# Patient Record
Sex: Male | Born: 1945 | Smoking: Former smoker
Health system: Southern US, Community
[De-identification: ages and names within clinical notes are randomized; demographics above are authoritative.]

## PROBLEM LIST (undated history)

## (undated) DIAGNOSIS — C801 Malignant (primary) neoplasm, unspecified: Secondary | ICD-10-CM

## (undated) DIAGNOSIS — I10 Essential (primary) hypertension: Secondary | ICD-10-CM

## (undated) DIAGNOSIS — Z87442 Personal history of urinary calculi: Secondary | ICD-10-CM

## (undated) DIAGNOSIS — M1990 Unspecified osteoarthritis, unspecified site: Secondary | ICD-10-CM

## (undated) HISTORY — PX: EYE SURGERY: SHX253

---

## 2012-11-05 ENCOUNTER — Telehealth: Payer: Self-pay | Admitting: Cardiology

## 2012-11-05 NOTE — Telephone Encounter (Signed)
Forward  5 pages from [facility] to Dr. Olga Millers for review on 11-05-12 ym

## 2012-12-04 ENCOUNTER — Ambulatory Visit: Payer: Self-pay | Admitting: Cardiology

## 2012-12-22 ENCOUNTER — Ambulatory Visit: Payer: Self-pay | Admitting: Cardiology

## 2017-05-20 ENCOUNTER — Other Ambulatory Visit: Payer: Self-pay | Admitting: Orthopedic Surgery

## 2017-07-01 ENCOUNTER — Encounter (HOSPITAL_COMMUNITY)
Admission: RE | Admit: 2017-07-01 | Discharge: 2017-07-01 | Disposition: A | Discharge status: Home / Self Care | Payer: Medicare HMO | Source: Ambulatory Visit | Attending: Orthopedic Surgery | Admitting: Orthopedic Surgery

## 2017-07-01 ENCOUNTER — Encounter (HOSPITAL_COMMUNITY): Payer: Self-pay

## 2017-07-01 ENCOUNTER — Ambulatory Visit (HOSPITAL_COMMUNITY)
Admission: RE | Admit: 2017-07-01 | Discharge: 2017-07-01 | Disposition: A | Discharge status: Home / Self Care | Payer: Medicare HMO | Source: Ambulatory Visit | Attending: Orthopedic Surgery | Admitting: Orthopedic Surgery

## 2017-07-01 DIAGNOSIS — Z79899 Other long term (current) drug therapy: Secondary | ICD-10-CM | POA: Diagnosis not present

## 2017-07-01 DIAGNOSIS — R918 Other nonspecific abnormal finding of lung field: Secondary | ICD-10-CM | POA: Insufficient documentation

## 2017-07-01 DIAGNOSIS — M170 Bilateral primary osteoarthritis of knee: Secondary | ICD-10-CM | POA: Insufficient documentation

## 2017-07-01 DIAGNOSIS — Z01818 Encounter for other preprocedural examination: Secondary | ICD-10-CM | POA: Diagnosis present

## 2017-07-01 DIAGNOSIS — Z87891 Personal history of nicotine dependence: Secondary | ICD-10-CM | POA: Insufficient documentation

## 2017-07-01 DIAGNOSIS — I10 Essential (primary) hypertension: Secondary | ICD-10-CM | POA: Insufficient documentation

## 2017-07-01 DIAGNOSIS — Z01812 Encounter for preprocedural laboratory examination: Secondary | ICD-10-CM | POA: Insufficient documentation

## 2017-07-01 DIAGNOSIS — R9431 Abnormal electrocardiogram [ECG] [EKG]: Secondary | ICD-10-CM | POA: Insufficient documentation

## 2017-07-01 HISTORY — DX: Essential (primary) hypertension: I10

## 2017-07-01 HISTORY — DX: Unspecified osteoarthritis, unspecified site: M19.90

## 2017-07-01 HISTORY — DX: Personal history of urinary calculi: Z87.442

## 2017-07-01 HISTORY — DX: Malignant (primary) neoplasm, unspecified: C80.1

## 2017-07-01 LAB — CBC WITH DIFFERENTIAL/PLATELET
Basophils Absolute: 0.1 10*3/uL (ref 0.0–0.1)
Basophils Relative: 1 %
EOS ABS: 0.2 10*3/uL (ref 0.0–0.7)
Eosinophils Relative: 4 %
HEMATOCRIT: 41.7 % (ref 39.0–52.0)
HEMOGLOBIN: 14 g/dL (ref 13.0–17.0)
LYMPHS ABS: 1.3 10*3/uL (ref 0.7–4.0)
Lymphocytes Relative: 19 %
MCH: 30.3 pg (ref 26.0–34.0)
MCHC: 33.6 g/dL (ref 30.0–36.0)
MCV: 90.3 fL (ref 78.0–100.0)
MONO ABS: 0.9 10*3/uL (ref 0.1–1.0)
MONOS PCT: 14 %
NEUTROS ABS: 4.1 10*3/uL (ref 1.7–7.7)
NEUTROS PCT: 62 %
Platelets: 174 10*3/uL (ref 150–400)
RBC: 4.62 MIL/uL (ref 4.22–5.81)
RDW: 12.9 % (ref 11.5–15.5)
WBC: 6.6 10*3/uL (ref 4.0–10.5)

## 2017-07-01 LAB — URINALYSIS, ROUTINE W REFLEX MICROSCOPIC
Bilirubin Urine: NEGATIVE
Glucose, UA: NEGATIVE mg/dL
KETONES UR: NEGATIVE mg/dL
Leukocytes, UA: NEGATIVE
Nitrite: NEGATIVE
PH: 5 (ref 5.0–8.0)
Protein, ur: NEGATIVE mg/dL
SPECIFIC GRAVITY, URINE: 1.018 (ref 1.005–1.030)

## 2017-07-01 LAB — COMPREHENSIVE METABOLIC PANEL
ALK PHOS: 66 U/L (ref 38–126)
ALT: 27 U/L (ref 17–63)
ANION GAP: 10 (ref 5–15)
AST: 20 U/L (ref 15–41)
Albumin: 3.5 g/dL (ref 3.5–5.0)
BILIRUBIN TOTAL: 0.6 mg/dL (ref 0.3–1.2)
BUN: 21 mg/dL — ABNORMAL HIGH (ref 6–20)
CALCIUM: 8.9 mg/dL (ref 8.9–10.3)
CO2: 23 mmol/L (ref 22–32)
Chloride: 107 mmol/L (ref 101–111)
Creatinine, Ser: 1.52 mg/dL — ABNORMAL HIGH (ref 0.61–1.24)
GFR calc non Af Amer: 44 mL/min — ABNORMAL LOW (ref >60)
GFR, EST AFRICAN AMERICAN: 51 mL/min — AB (ref >60)
Glucose, Bld: 120 mg/dL — ABNORMAL HIGH (ref 65–99)
Potassium: 4.1 mmol/L (ref 3.5–5.1)
Sodium: 140 mmol/L (ref 135–145)
TOTAL PROTEIN: 6.5 g/dL (ref 6.5–8.1)

## 2017-07-01 LAB — APTT: aPTT: 29 seconds (ref 24–36)

## 2017-07-01 LAB — SURGICAL PCR SCREEN
MRSA, PCR: NEGATIVE
STAPHYLOCOCCUS AUREUS: NEGATIVE

## 2017-07-01 LAB — PROTIME-INR
INR: 0.93
PROTHROMBIN TIME: 12.5 s (ref 11.4–15.2)

## 2017-07-01 NOTE — Pre-Procedure Instructions (Addendum)
Andres Roach  07/01/2017     No Pharmacies Listed   Your procedure is scheduled on 07/07/17  Report to [facility] Admitting at [place] A.M.  Call this number if you have problems the morning of surgery:  [phone]   Remember:  Do not eat food or drink liquids after midnight.  Take these medicines the morning of surgery with A SIP OF WATER amlodipine,gabapentin, oxycodone if needed  STOP all herbel meds, nsaids (aleve,naproxen,advil,ibuprofen) prior to surgery starting today including all vitamins/supplements, aspirin, fish oil,goodys, bc,diclofenac   Do not wear jewelry, make-up or nail polish.  Do not wear lotions, powders, or perfumes, or deoderant.  Do not shave 48 hours prior to surgery.  Men may shave face and neck.  Do not bring valuables to the hospital.  [facility] is not responsible for any belongings or valuables.  Contacts, dentures or bridgework may not be worn into surgery.  Leave your suitcase in the car.  After surgery it may be brought to your room.  For patients admitted to the hospital, discharge time will be determined by your treatment team.  Patients discharged the day of surgery will not be allowed to drive home.   Special instructions:   Special Instructions: [place] - Preparing for Surgery  Before surgery, you can play an important role.  Because skin is not sterile, your skin needs to be as free of germs as possible.  You can reduce the number of germs on you skin by washing with CHG (chlorahexidine gluconate) soap before surgery.  CHG is an antiseptic cleaner which kills germs and bonds with the skin to continue killing germs even after washing.  Please DO NOT use if you have an allergy to CHG or antibacterial soaps.  If your skin becomes reddened/irritated stop using the CHG and inform your nurse when you arrive at Short Stay.  Do not shave (including legs and underarms) for at least 48 hours prior to the first CHG shower.  You may  shave your face.  Please follow these instructions carefully:   1.  Shower with CHG Soap the night before surgery and the morning of Surgery.  2.  If you choose to wash your hair, wash your hair first as usual with your normal shampoo.  3.  After you shampoo, rinse your hair and body thoroughly to remove the Shampoo.  4.  Use CHG as you would any other liquid soap.  You can apply chg directly  to the skin and wash gently with scrungie or a clean washcloth.  5.  Apply the CHG Soap to your body ONLY FROM THE NECK DOWN.  Do not use on open wounds or open sores.  Avoid contact with your eyes ears, mouth and genitals (private parts).  Wash genitals (private parts)       with your normal soap.  6.  Wash thoroughly, paying special attention to the area where your surgery will be performed.  7.  Thoroughly rinse your body with warm water from the neck down.  8.  DO NOT shower/wash with your normal soap after using and rinsing off the CHG Soap.  9.  Pat yourself dry with a clean towel.            10.  Wear clean pajamas.            11.  Place clean sheets on your bed the night of your first shower and do not sleep with pets.  Day of Surgery  Do not apply any lotions/deodorants the morning of surgery.  Please wear clean clothes to the hospital/surgery center.  Please read over the  fact sheets that you were given.

## 2017-07-02 NOTE — Progress Notes (Signed)
Anesthesia Chart Review:  Pt is a 71 year old male scheduled for R total knee arthroplasty on 07/07/2017 with Dorna Leitz, M.D.  - PCP is Dustin Flock, PA at [facility] in [place], [place]  PMH includes: HTN. Former smoker. BMI 35.  Medications include: Amlodipine, lisinopril-HCTZ  BP 118/83   Pulse 65   Temp 36.6 C   Resp 20   Ht 6' (1.829 m)   Wt 258 lb 6.4 oz (117.2 kg)   SpO2 98%   BMI 35.05 kg/m   Preoperative labs reviewed.  Cr 1.52, BUN 21.   CXR 07/01/17: Minimal right lower lobe linear scarring or atelectasis. In the absence of prior studies demonstrating stability, consider follow-up film to confirm appropriate resolution.  EKG 07/01/17: NSR. Possible Inferior infarct, age undetermined. Appears stable when compared to EKG 12/15/13 (from [facility]).   I routed CMET and CXR results to PCP for follow up purposes.   If no changes, I anticipate pt can proceed with surgery as scheduled.   Willeen Cass, FNP-BC [facility] Short Stay Surgical Center/Anesthesiology Phone: [phone] 07/02/2017 11:43 AM

## 2017-07-04 MED ORDER — CEFAZOLIN SODIUM-DEXTROSE 2-4 GM/100ML-% IV SOLN
2.0000 g | INTRAVENOUS | Status: AC
  Administered 2017-07-07: 2 g via INTRAVENOUS
  Filled 2017-07-04: qty 100

## 2017-07-06 NOTE — H&P (Addendum)
TOTAL KNEE ADMISSION H&P  Patient is being admitted for right total knee arthroplasty.  Subjective:  Chief Complaint:right knee pain.  HPI: Andres Roach, 71 y.o. male, has a history of pain and functional disability in the right knee due to arthritis and has failed non-surgical conservative treatments for greater than 12 weeks to includeNSAID's and/or analgesics, corticosteriod injections, viscosupplementation injections, flexibility and strengthening excercises, weight reduction as appropriate and activity modification.  Onset of symptoms was gradual, starting 3 years ago with gradually worsening course since that time. The patient noted no past surgery on the right knee(s).  Patient currently rates pain in the right knee(s) at 9 out of 10 with activity. Patient has night pain, worsening of pain with activity and weight bearing, pain that interferes with activities of daily living, pain with passive range of motion and joint swelling.  Patient has evidence of subchondral sclerosis and periarticular osteophytes by imaging studies. This patient has had failure of all reasonable conservative care. There is no active infection.  There are no active problems to display for this patient.  Past Medical History:  Diagnosis Date  . Arthritis   . 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 History of kidney stones   . Hypertension     Past Surgical History:  Procedure Laterality Date  . EYE SURGERY Right    cataracts    No prescriptions prior to admission.   Allergies  Allergen Reactions  . No Known Allergies     Social History  Substance Use Topics  . Smoking status: Former Research scientist (life sciences)  . Smokeless tobacco: Never Used     Comment: smoked as teen few yrs  . Alcohol use No    No family history on file.   ROS ROS: I have reviewed the patient's review of systems thoroughly and there are no positive responses as relates to the HPI. Objective:  Physical Exam  Vital signs in last 24  hours:    Vitals:   07/07/17 1208  BP: (!) 159/90  Pulse: 65  Resp: 20  Temp: 98.2 F (36.8 C)  SpO2: 98%   Well-developed well-nourished patient in no acute distress. Alert and oriented x3 HEENT:within normal limits Cardiac: Regular rate and rhythm Pulmonary: Lungs clear to auscultation Abdomen: Soft and nontender.  Normal active bowel sounds  Musculoskeletal: (r knee: painful rom limited rom trace effusion mild varus) Labs: Recent Results (from the past 2160 hour(s))  Surgical pcr screen     Status: None   Collection Time: 07/01/17  9:05 AM  Result Value Ref Range   MRSA, PCR NEGATIVE NEGATIVE   Staphylococcus aureus NEGATIVE NEGATIVE    Comment:        The Xpert SA Assay (FDA approved for NASAL specimens in patients over 66 years of age), is one component of a comprehensive surveillance program.  Test performance has been validated by [facility] for patients greater than or equal to 65 year old. It is not intended to diagnose infection nor to guide or monitor treatment.   Urinalysis, Routine w reflex microscopic     Status: Abnormal   Collection Time: 07/01/17  9:05 AM  Result Value Ref Range   Color, Urine YELLOW YELLOW   APPearance CLEAR CLEAR   Specific Gravity, Urine 1.018 1.005 - 1.030   pH 5.0 5.0 - 8.0   Glucose, UA NEGATIVE NEGATIVE mg/dL   Hgb urine dipstick SMALL (A) NEGATIVE   Bilirubin Urine NEGATIVE NEGATIVE   Ketones,  ur NEGATIVE NEGATIVE mg/dL   Protein, ur NEGATIVE NEGATIVE mg/dL   Nitrite NEGATIVE NEGATIVE   Leukocytes, UA NEGATIVE NEGATIVE   RBC / HPF 0-5 0 - 5 RBC/hpf   WBC, UA 0-5 0 - 5 WBC/hpf   Bacteria, UA FEW (A) NONE SEEN   Squamous Epithelial / LPF 0-5 (A) NONE SEEN   Mucous PRESENT    Hyaline Casts, UA PRESENT   APTT     Status: None   Collection Time: 07/01/17  9:06 AM  Result Value Ref Range   aPTT 29 24 - 36 seconds  CBC WITH DIFFERENTIAL     Status: None   Collection Time: 07/01/17  9:06 AM  Result Value Ref Range    WBC 6.6 4.0 - 10.5 K/uL   RBC 4.62 4.22 - 5.81 MIL/uL   Hemoglobin 14.0 13.0 - 17.0 g/dL   HCT 41.7 39.0 - 52.0 %   MCV 90.3 78.0 - 100.0 fL   MCH 30.3 26.0 - 34.0 pg   MCHC 33.6 30.0 - 36.0 g/dL   RDW 12.9 11.5 - 15.5 %   Platelets 174 150 - 400 K/uL   Neutrophils Relative % 62 %   Neutro Abs 4.1 1.7 - 7.7 K/uL   Lymphocytes Relative 19 %   Lymphs Abs 1.3 0.7 - 4.0 K/uL   Monocytes Relative 14 %   Monocytes Absolute 0.9 0.1 - 1.0 K/uL   Eosinophils Relative 4 %   Eosinophils Absolute 0.2 0.0 - 0.7 K/uL   Basophils Relative 1 %   Basophils Absolute 0.1 0.0 - 0.1 K/uL  Comprehensive metabolic panel     Status: Abnormal   Collection Time: 07/01/17  9:06 AM  Result Value Ref Range   Sodium 140 135 - 145 mmol/L   Potassium 4.1 3.5 - 5.1 mmol/L   Chloride 107 101 - 111 mmol/L   CO2 23 22 - 32 mmol/L   Glucose, Bld 120 (H) 65 - 99 mg/dL   BUN 21 (H) 6 - 20 mg/dL   Creatinine, Ser 1.52 (H) 0.61 - 1.24 mg/dL   Calcium 8.9 8.9 - 10.3 mg/dL   Total Protein 6.5 6.5 - 8.1 g/dL   Albumin 3.5 3.5 - 5.0 g/dL   AST 20 15 - 41 U/L   ALT 27 17 - 63 U/L   Alkaline Phosphatase 66 38 - 126 U/L   Total Bilirubin 0.6 0.3 - 1.2 mg/dL   GFR calc non Af Amer 44 (L) >60 mL/min   GFR calc Af Amer 51 (L) >60 mL/min    Comment: (NOTE) The eGFR has been calculated using the CKD EPI equation. This calculation has not been validated in all clinical situations. eGFR's persistently <60 mL/min signify possible Chronic Kidney Disease.    Anion gap 10 5 - 15  Protime-INR     Status: None   Collection Time: 07/01/17  9:06 AM  Result Value Ref Range   Prothrombin Time 12.5 11.4 - 15.2 seconds   INR 0.93     Estimated body mass index is 35.05 kg/m as calculated from the following:   Height as of 07/01/17: 6' (1.829 m).   Weight as of 07/01/17: 117.2 kg (258 lb 6.4 oz).   Imaging Review Plain radiographs demonstrate moderate degenerative joint disease of the right knee(s). The overall alignment ismild  varus. The bone quality appears to be fair for age and reported activity level.  Assessment/Plan:  End stage arthritis, right knee   The patient history, physical examination, clinical  judgment of the provider and imaging studies are consistent with end stage degenerative joint disease of the right knee(s) and total knee arthroplasty is deemed medically necessary. The treatment options including medical management, injection therapy arthroscopy and arthroplasty were discussed at length. The risks and benefits of total knee arthroplasty were presented and reviewed. The risks due to aseptic loosening, infection, stiffness, patella tracking problems, thromboembolic complications and other imponderables were discussed. The patient acknowledged the explanation, agreed to proceed with the plan and consent was signed. Patient is being admitted for inpatient treatment for surgery, pain control, PT, OT, prophylactic antibiotics, VTE prophylaxis, progressive ambulation and ADL's and discharge planning. The patient is planning to be discharged home with home health services

## 2017-07-07 ENCOUNTER — Encounter (HOSPITAL_COMMUNITY)
Admission: RE | Disposition: A | Discharge status: Home Health | Payer: Self-pay | Source: Ambulatory Visit | Attending: Orthopedic Surgery

## 2017-07-07 ENCOUNTER — Inpatient Hospital Stay (HOSPITAL_COMMUNITY): Payer: Medicare HMO | Admitting: Anesthesiology

## 2017-07-07 ENCOUNTER — Inpatient Hospital Stay (HOSPITAL_COMMUNITY)
Admission: RE | Admit: 2017-07-07 | Discharge: 2017-07-10 | DRG: 470 | Disposition: A | Discharge status: Home Health | Payer: Medicare HMO | Source: Ambulatory Visit | Attending: Orthopedic Surgery | Admitting: Orthopedic Surgery

## 2017-07-07 ENCOUNTER — Inpatient Hospital Stay (HOSPITAL_COMMUNITY): Payer: Medicare HMO | Admitting: Emergency Medicine

## 2017-07-07 ENCOUNTER — Encounter (HOSPITAL_COMMUNITY): Payer: Self-pay

## 2017-07-07 DIAGNOSIS — Z7982 Long term (current) use of aspirin: Secondary | ICD-10-CM

## 2017-07-07 DIAGNOSIS — Z85828 Personal history of other malignant neoplasm of skin: Secondary | ICD-10-CM

## 2017-07-07 DIAGNOSIS — I10 Essential (primary) hypertension: Secondary | ICD-10-CM | POA: Diagnosis present

## 2017-07-07 DIAGNOSIS — M170 Bilateral primary osteoarthritis of knee: Secondary | ICD-10-CM | POA: Diagnosis present

## 2017-07-07 DIAGNOSIS — Z87891 Personal history of nicotine dependence: Secondary | ICD-10-CM | POA: Diagnosis not present

## 2017-07-07 DIAGNOSIS — M1711 Unilateral primary osteoarthritis, right knee: Secondary | ICD-10-CM | POA: Diagnosis present

## 2017-07-07 DIAGNOSIS — M1990 Unspecified osteoarthritis, unspecified site: Secondary | ICD-10-CM | POA: Diagnosis present

## 2017-07-07 HISTORY — PX: TOTAL KNEE ARTHROPLASTY: SHX125

## 2017-07-07 SURGERY — ARTHROPLASTY, KNEE, TOTAL
Anesthesia: Regional | Site: Knee | Laterality: Right

## 2017-07-07 MED ORDER — ALUM & MAG HYDROXIDE-SIMETH 200-200-20 MG/5ML PO SUSP: 30.0000 mL | ORAL | Status: DC | PRN

## 2017-07-07 MED ORDER — HYDROMORPHONE HCL 1 MG/ML IJ SOLN
0.2500 mg | INTRAMUSCULAR | Status: DC | PRN
  Administered 2017-07-07 (×4): 0.5 mg via INTRAVENOUS

## 2017-07-07 MED ORDER — ZOLPIDEM TARTRATE 5 MG PO TABS: 5.0000 mg | ORAL_TABLET | Freq: Every evening | ORAL | Status: DC | PRN

## 2017-07-07 MED ORDER — HYDROMORPHONE HCL 1 MG/ML IJ SOLN
INTRAMUSCULAR | Status: AC
  Filled 2017-07-07: qty 1

## 2017-07-07 MED ORDER — ACETAMINOPHEN 650 MG RE SUPP: 650.0000 mg | Freq: Four times a day (QID) | RECTAL | Status: DC | PRN

## 2017-07-07 MED ORDER — ONDANSETRON HCL 4 MG/2ML IJ SOLN: 4.0000 mg | Freq: Four times a day (QID) | INTRAMUSCULAR | Status: DC | PRN

## 2017-07-07 MED ORDER — ONDANSETRON HCL 4 MG/2ML IJ SOLN
INTRAMUSCULAR | Status: DC | PRN
  Administered 2017-07-07: 4 mg via INTRAVENOUS

## 2017-07-07 MED ORDER — BUPIVACAINE HCL (PF) 0.5 % IJ SOLN
INTRAMUSCULAR | Status: AC
  Filled 2017-07-07: qty 30

## 2017-07-07 MED ORDER — EPHEDRINE SULFATE-NACL 50-0.9 MG/10ML-% IV SOSY
PREFILLED_SYRINGE | INTRAVENOUS | Status: DC | PRN
  Administered 2017-07-07: 10 mg via INTRAVENOUS
  Administered 2017-07-07: 5 mg via INTRAVENOUS

## 2017-07-07 MED ORDER — MORPHINE SULFATE (PF) 4 MG/ML IV SOLN
1.0000 mg | INTRAVENOUS | Status: DC | PRN
  Administered 2017-07-07 – 2017-07-08 (×6): 2 mg via INTRAVENOUS
  Filled 2017-07-07 (×6): qty 1

## 2017-07-07 MED ORDER — FENTANYL CITRATE (PF) 250 MCG/5ML IJ SOLN
INTRAMUSCULAR | Status: DC | PRN
  Administered 2017-07-07 (×3): 50 ug via INTRAVENOUS

## 2017-07-07 MED ORDER — MENTHOL 3 MG MT LOZG: 1.0000 | LOZENGE | OROMUCOSAL | Status: DC | PRN

## 2017-07-07 MED ORDER — MIDAZOLAM HCL 2 MG/2ML IJ SOLN
INTRAMUSCULAR | Status: AC
  Filled 2017-07-07: qty 2

## 2017-07-07 MED ORDER — GABAPENTIN 300 MG PO CAPS
300.0000 mg | ORAL_CAPSULE | Freq: Once | ORAL | Status: AC
  Administered 2017-07-07: 300 mg via ORAL
  Filled 2017-07-07: qty 1

## 2017-07-07 MED ORDER — DEXTROSE 5 % IV SOLN
500.0000 mg | Freq: Four times a day (QID) | INTRAVENOUS | Status: DC | PRN
  Filled 2017-07-07: qty 5

## 2017-07-07 MED ORDER — HYDROCHLOROTHIAZIDE 12.5 MG PO CAPS
12.5000 mg | ORAL_CAPSULE | Freq: Every day | ORAL | Status: DC
  Administered 2017-07-09: 12.5 mg via ORAL
  Filled 2017-07-07 (×3): qty 1

## 2017-07-07 MED ORDER — SODIUM CHLORIDE 0.9 % IR SOLN
Status: DC | PRN
  Administered 2017-07-07: 3000 mL

## 2017-07-07 MED ORDER — BISACODYL 10 MG RE SUPP: 10.0000 mg | Freq: Every day | RECTAL | Status: DC | PRN

## 2017-07-07 MED ORDER — LISINOPRIL-HYDROCHLOROTHIAZIDE 20-12.5 MG PO TABS: 2.0000 | ORAL_TABLET | ORAL | Status: DC

## 2017-07-07 MED ORDER — CEFAZOLIN SODIUM-DEXTROSE 2-4 GM/100ML-% IV SOLN
2.0000 g | Freq: Four times a day (QID) | INTRAVENOUS | Status: AC
  Administered 2017-07-07 – 2017-07-08 (×2): 2 g via INTRAVENOUS
  Filled 2017-07-07 (×2): qty 100

## 2017-07-07 MED ORDER — CHLORHEXIDINE GLUCONATE 4 % EX LIQD: 60.0000 mL | Freq: Once | CUTANEOUS | Status: DC

## 2017-07-07 MED ORDER — FENTANYL CITRATE (PF) 100 MCG/2ML IJ SOLN
INTRAMUSCULAR | Status: AC
Start: 2017-07-07 — End: 2017-07-08
  Filled 2017-07-07: qty 2

## 2017-07-07 MED ORDER — ALBUMIN HUMAN 5 % IV SOLN
INTRAVENOUS | Status: DC | PRN
  Administered 2017-07-07: 14:00:00 via INTRAVENOUS

## 2017-07-07 MED ORDER — GLYCOPYRROLATE 0.2 MG/ML IV SOSY
PREFILLED_SYRINGE | INTRAVENOUS | Status: DC | PRN
  Administered 2017-07-07: .2 mg via INTRAVENOUS

## 2017-07-07 MED ORDER — CELECOXIB 200 MG PO CAPS
200.0000 mg | ORAL_CAPSULE | Freq: Once | ORAL | Status: AC
  Administered 2017-07-07: 200 mg via ORAL
  Filled 2017-07-07: qty 1

## 2017-07-07 MED ORDER — MIDAZOLAM HCL 2 MG/2ML IJ SOLN
INTRAMUSCULAR | Status: DC | PRN
  Administered 2017-07-07 (×2): 1 mg via INTRAVENOUS

## 2017-07-07 MED ORDER — SODIUM CHLORIDE 0.9 % IV SOLN
1000.0000 mg | Freq: Once | INTRAVENOUS | Status: AC
  Filled 2017-07-07: qty 10

## 2017-07-07 MED ORDER — ONDANSETRON HCL 4 MG/2ML IJ SOLN
INTRAMUSCULAR | Status: AC
  Filled 2017-07-07: qty 2

## 2017-07-07 MED ORDER — LISINOPRIL 20 MG PO TABS
20.0000 mg | ORAL_TABLET | Freq: Every day | ORAL | Status: DC
  Administered 2017-07-09: 20 mg via ORAL
  Filled 2017-07-07 (×3): qty 1

## 2017-07-07 MED ORDER — ACETAMINOPHEN 325 MG PO TABS
650.0000 mg | ORAL_TABLET | Freq: Four times a day (QID) | ORAL | Status: DC | PRN
  Administered 2017-07-09: 650 mg via ORAL
  Filled 2017-07-07 (×2): qty 2

## 2017-07-07 MED ORDER — LIDOCAINE 2% (20 MG/ML) 5 ML SYRINGE
INTRAMUSCULAR | Status: DC | PRN
  Administered 2017-07-07: 60 mg via INTRAVENOUS

## 2017-07-07 MED ORDER — BUPIVACAINE HCL (PF) 0.5 % IJ SOLN
INTRAMUSCULAR | Status: DC | PRN
  Administered 2017-07-07: 30 mL

## 2017-07-07 MED ORDER — PROPOFOL 500 MG/50ML IV EMUL
INTRAVENOUS | Status: DC | PRN
  Administered 2017-07-07: 50 ug/kg/min via INTRAVENOUS

## 2017-07-07 MED ORDER — ROPIVACAINE HCL 7.5 MG/ML IJ SOLN
INTRAMUSCULAR | Status: DC | PRN
  Administered 2017-07-07: 20 mL via PERINEURAL

## 2017-07-07 MED ORDER — DEXAMETHASONE SODIUM PHOSPHATE 10 MG/ML IJ SOLN
INTRAMUSCULAR | Status: AC
  Filled 2017-07-07: qty 1

## 2017-07-07 MED ORDER — METHOCARBAMOL 500 MG PO TABS
500.0000 mg | ORAL_TABLET | Freq: Four times a day (QID) | ORAL | Status: DC | PRN
  Administered 2017-07-07 – 2017-07-10 (×8): 500 mg via ORAL
  Filled 2017-07-07 (×8): qty 1

## 2017-07-07 MED ORDER — METHOCARBAMOL 500 MG PO TABS
ORAL_TABLET | ORAL | Status: AC
  Administered 2017-07-07: 500 mg via ORAL
  Filled 2017-07-07: qty 1

## 2017-07-07 MED ORDER — DIPHENHYDRAMINE HCL 12.5 MG/5ML PO ELIX: 12.5000 mg | ORAL_SOLUTION | ORAL | Status: DC | PRN

## 2017-07-07 MED ORDER — FENTANYL CITRATE (PF) 100 MCG/2ML IJ SOLN
100.0000 ug | Freq: Once | INTRAMUSCULAR | Status: AC
  Administered 2017-07-07: 100 ug via INTRAVENOUS

## 2017-07-07 MED ORDER — ATROPINE SULFATE 0.4 MG/ML IV SOSY
PREFILLED_SYRINGE | INTRAVENOUS | Status: DC | PRN
  Administered 2017-07-07: .2 mg via INTRAVENOUS

## 2017-07-07 MED ORDER — METOCLOPRAMIDE HCL 5 MG/ML IJ SOLN: 5.0000 mg | Freq: Three times a day (TID) | INTRAMUSCULAR | Status: DC | PRN

## 2017-07-07 MED ORDER — PHENYLEPHRINE HCL 10 MG/ML IJ SOLN
INTRAVENOUS | Status: DC | PRN
  Administered 2017-07-07: 50 ug/min via INTRAVENOUS

## 2017-07-07 MED ORDER — BUPIVACAINE LIPOSOME 1.3 % IJ SUSP
20.0000 mL | INTRAMUSCULAR | Status: AC
  Administered 2017-07-07: 20 mL
  Filled 2017-07-07: qty 20

## 2017-07-07 MED ORDER — METOCLOPRAMIDE HCL 5 MG PO TABS: 5.0000 mg | ORAL_TABLET | Freq: Three times a day (TID) | ORAL | Status: DC | PRN

## 2017-07-07 MED ORDER — OXYCODONE HCL 5 MG PO TABS
5.0000 mg | ORAL_TABLET | ORAL | Status: DC | PRN
  Administered 2017-07-07 – 2017-07-10 (×19): 10 mg via ORAL
  Filled 2017-07-07 (×19): qty 2

## 2017-07-07 MED ORDER — ASPIRIN EC 325 MG PO TBEC
325.0000 mg | DELAYED_RELEASE_TABLET | Freq: Two times a day (BID) | ORAL | Status: DC
  Administered 2017-07-07 – 2017-07-10 (×7): 325 mg via ORAL
  Filled 2017-07-07 (×7): qty 1

## 2017-07-07 MED ORDER — EPHEDRINE 5 MG/ML INJ
INTRAVENOUS | Status: AC
  Filled 2017-07-07: qty 10

## 2017-07-07 MED ORDER — ACETAMINOPHEN 500 MG PO TABS
1000.0000 mg | ORAL_TABLET | Freq: Once | ORAL | Status: AC
  Administered 2017-07-07: 1000 mg via ORAL
  Filled 2017-07-07 (×2): qty 2

## 2017-07-07 MED ORDER — OXYCODONE HCL 5 MG PO TABS
ORAL_TABLET | ORAL | Status: AC
  Administered 2017-07-07: 10 mg via ORAL
  Filled 2017-07-07: qty 2

## 2017-07-07 MED ORDER — PROPOFOL 10 MG/ML IV BOLUS
INTRAVENOUS | Status: DC | PRN
Start: 2017-07-07 — End: 2017-07-07
  Administered 2017-07-07: 50 mg via INTRAVENOUS
  Administered 2017-07-07: 150 mg via INTRAVENOUS

## 2017-07-07 MED ORDER — FENTANYL CITRATE (PF) 250 MCG/5ML IJ SOLN
INTRAMUSCULAR | Status: AC
  Filled 2017-07-07: qty 5

## 2017-07-07 MED ORDER — SODIUM CHLORIDE 0.9 % IJ SOLN
INTRAMUSCULAR | Status: DC | PRN
  Administered 2017-07-07: 20 mL

## 2017-07-07 MED ORDER — ONDANSETRON HCL 4 MG PO TABS: 4.0000 mg | ORAL_TABLET | Freq: Four times a day (QID) | ORAL | Status: DC | PRN

## 2017-07-07 MED ORDER — PHENOL 1.4 % MT LIQD: 1.0000 | OROMUCOSAL | Status: DC | PRN

## 2017-07-07 MED ORDER — CELECOXIB 200 MG PO CAPS
200.0000 mg | ORAL_CAPSULE | Freq: Two times a day (BID) | ORAL | Status: DC
  Administered 2017-07-07 – 2017-07-10 (×6): 200 mg via ORAL
  Filled 2017-07-07 (×6): qty 1

## 2017-07-07 MED ORDER — LACTATED RINGERS IV SOLN
INTRAVENOUS | Status: DC
  Administered 2017-07-07: 13:00:00 via INTRAVENOUS

## 2017-07-07 MED ORDER — AMLODIPINE BESYLATE 5 MG PO TABS
5.0000 mg | ORAL_TABLET | Freq: Every day | ORAL | Status: DC
  Administered 2017-07-09 – 2017-07-10 (×2): 5 mg via ORAL
  Filled 2017-07-07 (×3): qty 1

## 2017-07-07 MED ORDER — TRANEXAMIC ACID 1000 MG/10ML IV SOLN
1000.0000 mg | INTRAVENOUS | Status: DC
  Filled 2017-07-07: qty 10

## 2017-07-07 MED ORDER — GABAPENTIN 400 MG PO CAPS
400.0000 mg | ORAL_CAPSULE | Freq: Three times a day (TID) | ORAL | Status: DC
  Administered 2017-07-07 – 2017-07-10 (×9): 400 mg via ORAL
  Filled 2017-07-07 (×9): qty 1

## 2017-07-07 MED ORDER — 0.9 % SODIUM CHLORIDE (POUR BTL) OPTIME
TOPICAL | Status: DC | PRN
Start: 2017-07-07 — End: 2017-07-07
  Administered 2017-07-07: 1000 mL

## 2017-07-07 MED ORDER — MIDAZOLAM HCL 2 MG/2ML IJ SOLN
2.0000 mg | Freq: Once | INTRAMUSCULAR | Status: AC
  Administered 2017-07-07: 1 mg via INTRAVENOUS

## 2017-07-07 SURGICAL SUPPLY — 62 items
BANDAGE ACE 6X5 VEL STRL LF (GAUZE/BANDAGES/DRESSINGS) ×3 IMPLANT
BANDAGE ESMARK 6X9 LF (GAUZE/BANDAGES/DRESSINGS) ×1 IMPLANT
BENZOIN TINCTURE PRP APPL 2/3 (GAUZE/BANDAGES/DRESSINGS) ×3 IMPLANT
BLADE SAGITTAL 25.0X1.19X90 (BLADE) ×2 IMPLANT
BLADE SAGITTAL 25.0X1.19X90MM (BLADE) ×1
BLADE SAW SAG 90X13X1.27 (BLADE) ×3 IMPLANT
BNDG ESMARK 6X9 LF (GAUZE/BANDAGES/DRESSINGS) ×3
BOWL SMART MIX CTS (DISPOSABLE) ×3 IMPLANT
CAPT KNEE TOTAL 3 ATTUNE ×3 IMPLANT
CEMENT HV SMART SET (Cement) ×6 IMPLANT
CLOSURE STERI-STRIP 1/2X4 (GAUZE/BANDAGES/DRESSINGS) ×2
CLOSURE WOUND 1/2 X4 (GAUZE/BANDAGES/DRESSINGS)
CLSR STERI-STRIP ANTIMIC 1/2X4 (GAUZE/BANDAGES/DRESSINGS) ×4 IMPLANT
COVER SURGICAL LIGHT HANDLE (MISCELLANEOUS) ×3 IMPLANT
CUFF TOURNIQUET SINGLE 34IN LL (TOURNIQUET CUFF) ×3 IMPLANT
CUFF TOURNIQUET SINGLE 44IN (TOURNIQUET CUFF) IMPLANT
DRAPE EXTREMITY T 121X128X90 (DRAPE) ×3 IMPLANT
DRAPE U-SHAPE 47X51 STRL (DRAPES) ×3 IMPLANT
DRSG AQUACEL AG ADV 3.5X10 (GAUZE/BANDAGES/DRESSINGS) ×3 IMPLANT
DRSG PAD ABDOMINAL 8X10 ST (GAUZE/BANDAGES/DRESSINGS) ×3 IMPLANT
DURAPREP 26ML APPLICATOR (WOUND CARE) ×3 IMPLANT
ELECT REM PT RETURN 9FT ADLT (ELECTROSURGICAL) ×3
ELECTRODE REM PT RTRN 9FT ADLT (ELECTROSURGICAL) ×1 IMPLANT
EVACUATOR 1/8 PVC DRAIN (DRAIN) IMPLANT
FACESHIELD WRAPAROUND (MASK) ×3 IMPLANT
GAUZE SPONGE 4X4 12PLY STRL (GAUZE/BANDAGES/DRESSINGS) ×3 IMPLANT
GLOVE BIOGEL PI IND STRL 8 (GLOVE) ×2 IMPLANT
GLOVE BIOGEL PI INDICATOR 8 (GLOVE) ×4
GLOVE ECLIPSE 7.5 STRL STRAW (GLOVE) ×6 IMPLANT
GOWN STRL REUS W/ TWL LRG LVL3 (GOWN DISPOSABLE) ×1 IMPLANT
GOWN STRL REUS W/ TWL XL LVL3 (GOWN DISPOSABLE) ×2 IMPLANT
GOWN STRL REUS W/TWL LRG LVL3 (GOWN DISPOSABLE) ×2
GOWN STRL REUS W/TWL XL LVL3 (GOWN DISPOSABLE) ×4
HANDPIECE INTERPULSE COAX TIP (DISPOSABLE) ×2
HOOD PEEL AWAY FACE SHEILD DIS (HOOD) ×9 IMPLANT
IMMOBILIZER KNEE 22 (SOFTGOODS) ×3 IMPLANT
IMMOBILIZER KNEE 22 UNIV (SOFTGOODS) ×3 IMPLANT
KIT BASIN OR (CUSTOM PROCEDURE TRAY) ×3 IMPLANT
KIT ROOM TURNOVER OR (KITS) ×3 IMPLANT
MANIFOLD NEPTUNE II (INSTRUMENTS) ×3 IMPLANT
NEEDLE 22X1 1/2 (OR ONLY) (NEEDLE) ×3 IMPLANT
NS IRRIG 1000ML POUR BTL (IV SOLUTION) ×3 IMPLANT
PACK TOTAL JOINT (CUSTOM PROCEDURE TRAY) ×3 IMPLANT
PAD ARMBOARD 7.5X6 YLW CONV (MISCELLANEOUS) ×6 IMPLANT
PENCIL BUTTON HOLSTER BLD 10FT (ELECTRODE) ×3 IMPLANT
PIN STEINMAN FIXATION KNEE (PIN) ×3 IMPLANT
SET HNDPC FAN SPRY TIP SCT (DISPOSABLE) ×1 IMPLANT
STAPLER VISISTAT 35W (STAPLE) IMPLANT
STRIP CLOSURE SKIN 1/2X4 (GAUZE/BANDAGES/DRESSINGS) IMPLANT
SUCTION FRAZIER HANDLE 10FR (MISCELLANEOUS) ×2
SUCTION TUBE FRAZIER 10FR DISP (MISCELLANEOUS) ×1 IMPLANT
SUT MNCRL AB 3-0 PS2 18 (SUTURE) ×6 IMPLANT
SUT VIC AB 0 CTB1 27 (SUTURE) ×6 IMPLANT
SUT VIC AB 1 CT1 27 (SUTURE) ×4
SUT VIC AB 1 CT1 27XBRD ANBCTR (SUTURE) ×2 IMPLANT
SUT VIC AB 2-0 CTB1 (SUTURE) ×6 IMPLANT
SYR 50ML LL SCALE MARK (SYRINGE) ×3 IMPLANT
TOWEL OR 17X24 6PK STRL BLUE (TOWEL DISPOSABLE) ×3 IMPLANT
TOWEL OR 17X26 10 PK STRL BLUE (TOWEL DISPOSABLE) ×3 IMPLANT
TRAY CATH 16FR W/PLASTIC CATH (SET/KITS/TRAYS/PACK) IMPLANT
TRAY FOLEY W/METER SILVER 16FR (SET/KITS/TRAYS/PACK) IMPLANT
WRAP KNEE MAXI GEL POST OP (GAUZE/BANDAGES/DRESSINGS) ×3 IMPLANT

## 2017-07-07 NOTE — Progress Notes (Signed)
Orthopedic Tech Progress Note Patient Details:  Andres Roach 07/15/1946 [id number]  CPM Right Knee CPM Right Knee: On Right Knee Flexion (Degrees): 90 Right Knee Extension (Degrees): 0 Additional Comments: Foot roll   [name] 07/07/2017, 5:37 PM

## 2017-07-07 NOTE — Anesthesia Procedure Notes (Signed)
Procedure Name: LMA Insertion Date/Time: 07/07/2017 1:36 PM Performed by: Mervyn Gay Pre-anesthesia Checklist: Patient identified, Patient being monitored, Timeout performed, Emergency Drugs available and Suction available Patient Re-evaluated:Patient Re-evaluated prior to induction Oxygen Delivery Method: Circle System Utilized Preoxygenation: Pre-oxygenation with 100% oxygen Induction Type: IV induction Ventilation: Mask ventilation without difficulty LMA: LMA inserted LMA Size: 5.0 Number of attempts: 1 Placement Confirmation: positive ETCO2 and breath sounds checked- equal and bilateral Tube secured with: Tape Dental Injury: Teeth and Oropharynx as per pre-operative assessment

## 2017-07-07 NOTE — Brief Op Note (Signed)
07/07/2017  3:00 PM  PATIENT:  Andres Roach  71 y.o. male  PRE-OPERATIVE DIAGNOSIS:  BILATERAL KNEE OSTEOARTHRITIS  POST-OPERATIVE DIAGNOSIS:  BILATERAL KNEE OSTEOARTHRITIS  PROCEDURE:  Procedure(s): TOTAL KNEE ARTHROPLASTY (Right)  SURGEON:  Surgeon(s) and Role:    Dorna Leitz, MD - Primary  PHYSICIAN ASSISTANT:   ASSISTANTS: none   ANESTHESIA:   general  EBL:  Total I/O In: 1250 [I.V.:1000; IV Piggyback:250] Out: -   BLOOD ADMINISTERED:none  DRAINS: none   LOCAL MEDICATIONS USED:  MARCAINE    and OTHER experel  SPECIMEN:  No Specimen  DISPOSITION OF SPECIMEN:  N/A  COUNTS:  YES  TOURNIQUET:   Total Tourniquet Time Documented: Thigh (Right) - 58 minutes Total: Thigh (Right) - 58 minutes   DICTATION: .Other Dictation: Dictation Number [id number]  PLAN OF CARE: Admit to inpatient   PATIENT DISPOSITION:  PACU - hemodynamically stable.   Delay start of Pharmacological VTE agent (>24hrs) due to surgical blood loss or risk of bleeding: no

## 2017-07-07 NOTE — Transfer of Care (Signed)
Immediate Anesthesia Transfer of Care Note  Patient: Andres Roach  Procedure(s) Performed: Procedure(s): TOTAL KNEE ARTHROPLASTY (Right)  Patient Location: PACU  Anesthesia Type:GA combined with regional for post-op pain  Level of Consciousness: awake, alert  and oriented  Airway & Oxygen Therapy: Patient Spontanous Breathing and Patient connected to nasal cannula oxygen  Post-op Assessment: Report given to RN and Post -op Vital signs reviewed and stable  Post vital signs: Reviewed and stable  Last Vitals:  Vitals:   07/07/17 1208 07/07/17 1536  BP: (!) 159/90   Pulse: 65   Resp: 20   Temp: 36.8 C (!) (P) 36.2 C  SpO2: 98%     Last Pain:  Vitals:   07/07/17 1208  TempSrc: Oral      Patients Stated Pain Goal: 6 (37/35/78 9784)  Complications: No apparent anesthesia complications

## 2017-07-07 NOTE — Anesthesia Procedure Notes (Signed)
Anesthesia Regional Block: Adductor canal block   Pre-Anesthetic Checklist: ,, timeout performed, Correct Patient, Correct Site, Correct Laterality, Correct Procedure, Correct Position, site marked, Risks and benefits discussed, pre-op evaluation,  At surgeon's request and post-op pain management  Laterality: Right  Prep: Maximum Sterile Barrier Precautions used, chloraprep       Needles:  Injection technique: Single-shot  Needle Type: Echogenic Stimulator Needle     Needle Length: 9cm  Needle Gauge: 21     Additional Needles:   Procedures: ultrasound guided,,,,,,,,  Narrative:  Start time: 07/07/2017 12:33 PM End time: 07/07/2017 12:43 PM Injection made incrementally with aspirations every 5 mL. Anesthesiologist: Roderic Palau  Additional Notes: 2% Lidocaine skin wheel.

## 2017-07-07 NOTE — Anesthesia Preprocedure Evaluation (Addendum)
Anesthesia Evaluation  Patient identified by MRN, date of birth, ID band Patient awake    Reviewed: Allergy & Precautions, H&P , NPO status , Patient's Chart, lab work & pertinent test results  Airway Mallampati: III  TM Distance: >3 FB Neck ROM: Full    Dental no notable dental hx. (+) Poor Dentition, Dental Advisory Given   Pulmonary neg pulmonary ROS, former smoker,    Pulmonary exam normal breath sounds clear to auscultation       Cardiovascular hypertension, Pt. on medications  Rhythm:Regular Rate:Normal     Neuro/Psych negative neurological ROS  negative psych ROS   GI/Hepatic negative GI ROS, Neg liver ROS,   Endo/Other  negative endocrine ROS  Renal/GU negative Renal ROS  negative genitourinary   Musculoskeletal  (+) Arthritis , Osteoarthritis,    Abdominal   Peds  Hematology negative hematology ROS (+)   Anesthesia Other Findings   Reproductive/Obstetrics negative OB ROS                            Anesthesia Physical Anesthesia Plan  ASA: II  Anesthesia Plan: Spinal   Post-op Pain Management:  Regional for Post-op pain   Induction: Intravenous  PONV Risk Score and Plan: 2 and Ondansetron, Dexamethasone, Propofol infusion and Midazolam  Airway Management Planned: Simple Face Mask  Additional Equipment:   Intra-op Plan:   Post-operative Plan:   Informed Consent: I have reviewed the patients History and Physical, chart, labs and discussed the procedure including the risks, benefits and alternatives for the proposed anesthesia with the patient or authorized representative who has indicated his/her understanding and acceptance.   Dental advisory given  Plan Discussed with: CRNA  Anesthesia Plan Comments:        Anesthesia Quick Evaluation

## 2017-07-07 NOTE — Anesthesia Procedure Notes (Signed)
Procedure Name: MAC Date/Time: 07/07/2017 1:09 PM Performed by: Mervyn Gay Pre-anesthesia Checklist: Patient identified, Patient being monitored, Timeout performed, Emergency Drugs available and Suction available Patient Re-evaluated:Patient Re-evaluated prior to induction Oxygen Delivery Method: Nasal cannula and Simple face mask Number of attempts: 1 Placement Confirmation: positive ETCO2 Dental Injury: Teeth and Oropharynx as per pre-operative assessment

## 2017-07-07 NOTE — Anesthesia Postprocedure Evaluation (Signed)
Anesthesia Post Note  Patient: ANMOL FLECK  Procedure(s) Performed: Procedure(s) (LRB): TOTAL KNEE ARTHROPLASTY (Right)     Patient location during evaluation: PACU Anesthesia Type: Regional and General Level of consciousness: awake and alert Pain management: pain level controlled Vital Signs Assessment: post-procedure vital signs reviewed and stable Respiratory status: spontaneous breathing, nonlabored ventilation, respiratory function stable and patient connected to nasal cannula oxygen Cardiovascular status: blood pressure returned to baseline and stable Postop Assessment: no signs of nausea or vomiting Anesthetic complications: no    Last Vitals:  Vitals:   07/07/17 1650 07/07/17 1700  BP: 120/81   Pulse: 60 60  Resp: 12 12  Temp:    SpO2: 99% 99%    Last Pain:  Vitals:   07/07/17 1700  TempSrc:   PainSc: Asleep                 Konnor Vondrasek,W. EDMOND

## 2017-07-08 ENCOUNTER — Encounter (HOSPITAL_COMMUNITY): Payer: Self-pay | Admitting: General Practice

## 2017-07-08 DIAGNOSIS — M1711 Unilateral primary osteoarthritis, right knee: Secondary | ICD-10-CM | POA: Diagnosis present

## 2017-07-08 HISTORY — PX: TOTAL KNEE ARTHROPLASTY: SHX125

## 2017-07-08 NOTE — Social Work (Signed)
CSW discussed bed offers and pat has accepted bed offer at [facility].  CSW faxed clinicals to [facility]  Elissa Hefty, LCSW Clinical Social Worker [phone]

## 2017-07-08 NOTE — Clinical Social Work Note (Signed)
Clinical Social Work Assessment  Patient Details  Name: Andres Roach MRN: [id number] Date of Birth: 11-18-1946  Date of referral:  07/08/17               Reason for consult:  Facility Placement                Permission sought to share information with:  Chartered certified accountant granted to share information::  Yes, Verbal Permission Granted  Name::        Agency::   SNF-[place] area  Relationship::     Contact Information:     Housing/Transportation Living arrangements for the past 2 months:  Single Family Home Source of Information:  Patient Patient Interpreter Needed:  None Criminal Activity/Legal Involvement Pertinent to Current Situation/Hospitalization:  No - Comment as needed Significant Relationships:  Adult Children Lives with:  Adult Children Do you feel safe going back to the place where you live?  No Need for family participation in patient care:  No (Coment)  Care giving concerns:  Pt from home. He was independent prior to hospitalization. He has adult and teenage children that will assist him after he completes SNF. He will not have support needed with impairment. Pt not safe to return home at this time.  Social Worker assessment / plan:  CSW met with patient at bedside to introduce self and clinical team's recommendation at DC to go to SNF. Pt in agreement. CSW explained SNF options/placement.  CSW obtained permission to send out offers to the [place] area which is closer to home.  FL2 complete. Passr completed. Offers sent.  Employment status:  Retired Nurse, adult PT Recommendations:  [place] / Referral to community resources:  [place]  Patient/Family's Response to care:  Patient appreciative of CSW assistance with SNF placement. No issues or concerns.  Patient/Family's Understanding of and Emotional Response to Diagnosis, Current Treatment, and Prognosis:  Patient  has good understanding of diagnosis, current treatment and prognosis at this time. Pt hopeful he will improve with SNF. No issues or concerns identified at this time.  Emotional Assessment Appearance:  Appears stated age Attitude/Demeanor/Rapport:   (Cooperative) Affect (typically observed):  Accepting, Appropriate Orientation:  Oriented to Self, Oriented to Place, Oriented to  Time, Oriented to Situation Alcohol / Substance use:  Not Applicable Psych involvement (Current and /or in the community):  No (Comment)  Discharge Needs  Concerns to be addressed:  Care Coordination Readmission within the last 30 days:  No Current discharge risk:  Dependent with Mobility, Physical Impairment Barriers to Discharge:  No Barriers Identified   Normajean Baxter, LCSW 07/08/2017, 11:31 AM

## 2017-07-08 NOTE — Evaluation (Signed)
Physical Therapy Evaluation Patient Details Name: Andres Roach MRN: [id number] DOB: 22-Aug-1946 Today's Date: 07/08/2017   History of Present Illness  Pt is a 71 y/o male s/p elective R TKA. PMH includes skin cancer, HTN, and arthritis.   Clinical Impression  Pt is s/p surgery above with deficits below. PTA, pt reports he occasionally used his cane for mobility, but was otherwise independent. Upon eval, pt extremely limited by post op pain and weakness, as well as decreased balance. Pt requiring mod to max A and only able to ambulate short distance secondary to R knee buckling and pain. Pt reports he is going home, however, feel he is currently a high fall risk given current mobility limitations and assist required. Will progress mobility to see if pt improves with mobility tolerance, however, may need SNF at d/c if pt does not progress well acutely to increase independence and safety with mobility. Will continue to follow acutely to maximize functional mobility independence and safety and progress mobility according to pt tolerance.     Follow Up Recommendations DC plan and follow up therapy as arranged by surgeon;Supervision for mobility/OOB;SNF    Equipment Recommendations  None recommended by PT    Recommendations for Other Services       Precautions / Restrictions Precautions Precautions: Knee Precaution Booklet Issued: Yes (comment) Precaution Comments: Reviewed supine ther ex with pt.  Restrictions Weight Bearing Restrictions: Yes RLE Weight Bearing: Weight bearing as tolerated      Mobility  Bed Mobility Overal bed mobility: Needs Assistance Bed Mobility: Supine to Sit     Supine to sit: Mod assist     General bed mobility comments: Mod A for RLE management.   Transfers Overall transfer level: Needs assistance Equipment used: Rolling walker (2 wheeled) Transfers: Sit to/from Stand Sit to Stand: Max assist;From elevated surface         General transfer  comment: MAx A from elevated surface to stand. Required 4 attempts to stand. Required verbal cues for upright posture once standing, however, pt continued to present with flexed posture.   Ambulation/Gait Ambulation/Gait assistance: Mod assist Ambulation Distance (Feet): 10 Feet Assistive device: Rolling walker (2 wheeled) Gait Pattern/deviations: Step-to pattern;Decreased step length - right;Decreased step length - left;Decreased weight shift to right;Antalgic Gait velocity: Decreased Gait velocity interpretation: Below normal speed for age/gender General Gait Details: Slow, antalgic gait. Limited distance secondary to pain and R knee buckling. Encouraged WB on RLE, however, pt not able secondary to pain. Verbal cues for upright posture throughout and sequencing cues with RW. Demonstrating poor technique with RW when turning to sit in the chair and required cues to keep RW with him.   Stairs            Wheelchair Mobility    Modified Rankin (Stroke Patients Only)       Balance Overall balance assessment: Needs assistance Sitting-balance support: No upper extremity supported;Feet supported Sitting balance-Leahy Scale: Fair     Standing balance support: Bilateral upper extremity supported;During functional activity Standing balance-Leahy Scale: Poor Standing balance comment: Reliant on UE support and external assist to maintain balance.                              Pertinent Vitals/Pain Pain Assessment: 0-10 Pain Score: 6  Pain Location: R knee  Pain Descriptors / Indicators: Aching;Operative site guarding;Sore Pain Intervention(s): Limited activity within patient's tolerance;Monitored during session;Repositioned    Home Living Family/patient expects  to be discharged to:: Private residence Living Arrangements: Children Available Help at Discharge: Family;Available 24 hours/day Type of Home: House Home Access: Stairs to enter Entrance Stairs-Rails:  None Entrance Stairs-Number of Steps: 2 Home Layout: Two level;Able to live on main level with bedroom/bathroom Home Equipment: Bedside commode;Walker - 2 wheels;Cane - single point;Electric scooter      Prior Function Level of Independence: Independent with assistive device(s)         Comments: Occaisional use of cane      Hand Dominance   Dominant Hand: Right    Extremity/Trunk Assessment   Upper Extremity Assessment Upper Extremity Assessment: Defer to OT evaluation    Lower Extremity Assessment Lower Extremity Assessment: RLE deficits/detail RLE Deficits / Details: Sensory in tact Deficits consistent with post op pain and weakness. Able to perform exercises below.     Cervical / Trunk Assessment Cervical / Trunk Assessment: Normal  Communication   Communication: HOH  Cognition Arousal/Alertness: Awake/alert Behavior During Therapy: WFL for tasks assessed/performed Overall Cognitive Status: Within Functional Limits for tasks assessed                                        General Comments General comments (skin integrity, edema, etc.): Social work in during session to discuss facility placement.     Exercises Total Joint Exercises Ankle Circles/Pumps: AROM;Both;10 reps;Supine Quad Sets: AROM;Right;10 reps;Supine Towel Squeeze: AROM;Both;10 reps;Supine Heel Slides: AAROM;Right;10 reps;Supine Goniometric ROM: 20-70   Assessment/Plan    PT Assessment Patient needs continued PT services  PT Problem List Decreased strength;Decreased range of motion;Decreased balance;Decreased mobility;Decreased knowledge of use of DME;Decreased knowledge of precautions;Pain       PT Treatment Interventions DME instruction;Gait training;Stair training;Functional mobility training;Therapeutic activities;Therapeutic exercise;Balance training;Neuromuscular re-education;Patient/family education    PT Goals (Current goals can be found in the Care Plan section)   Acute Rehab PT Goals Patient Stated Goal: to decrease pain  PT Goal Formulation: With patient Time For Goal Achievement: 07/15/17 Potential to Achieve Goals: Good    Frequency 7X/week   Barriers to discharge        Co-evaluation               AM-PAC PT "6 Clicks" Daily Activity  Outcome Measure Difficulty turning over in bed (including adjusting bedclothes, sheets and blankets)?: Total Difficulty moving from lying on back to sitting on the side of the bed? : Total Difficulty sitting down on and standing up from a chair with arms (e.g., wheelchair, bedside commode, etc,.)?: Total Help needed moving to and from a bed to chair (including a wheelchair)?: A Lot Help needed walking in hospital room?: A Lot Help needed climbing 3-5 steps with a railing? : Total 6 Click Score: 8    End of Session Equipment Utilized During Treatment: Gait belt Activity Tolerance: Patient limited by pain Patient left: in chair;with call bell/phone within reach;with nursing/sitter in room Nurse Communication: Mobility status PT Visit Diagnosis: Other abnormalities of gait and mobility (R26.89);Pain Pain - Right/Left: Right Pain - part of body: Knee    Time: 2202-5427 PT Time Calculation (min) (ACUTE ONLY): 32 min   Charges:   PT Evaluation $PT Eval Moderate Complexity: 1 Mod PT Treatments $Gait Training: 8-22 mins   PT G Codes:        Leighton Ruff, PT, DPT  Acute Rehabilitation Services  Pager: [phone]   Rudean Hitt 07/08/2017, 11:39 AM

## 2017-07-08 NOTE — Op Note (Signed)
NAME:  Andres Roach, Andres Roach NO.:  [id number]  MEDICAL RECORD NO.:  [id number]  LOCATION:                                 FACILITY:  PHYSICIAN:  Alta Corning, M.D.        DATE OF BIRTH:  DATE OF PROCEDURE:  07/07/2017 DATE OF DISCHARGE:                              OPERATIVE REPORT   PREOPERATIVE DIAGNOSIS:  End-stage degenerative joint disease, bilateral knees.  POSTOPERATIVE DIAGNOSIS:  End-stage degenerative joint disease, bilateral knees.  PROCEDURE:  Right total knee replacement with an Attune system size 7 femur, size 7 tibia, size 6 mm bridging bearing, and a 41 mm all polyethylene patella.  SURGEONS:  Alta Corning, M.D.  ASSIST:  None.  ANESTHESIA:  General.  BRIEF HISTORY:  Andres Roach is a 71 year old male with long history of significant complaints of bilateral knee pain.  He had been treated conservatively for prolonged period of time with activity modification, injection, therapy.  After failure of all of this, he was taken to the operating room for right total knee replacement.  He basically had bilateral severe disease and we elected to do his worst side first, which is right.  He is brought to the operating room for this procedure.  DESCRIPTION OF PROCEDURE:  The patient was taken to the operating room and after adequate anesthesia was obtained with general anesthetic, the patient was placed supine on the operating table.  The right leg was prepped and draped in usual sterile fashion.  Following this, leg was exsanguinated.  Blood pressure tourniquet was inflated to 300 mmHg. Following this, a midline incision was made.  The subcutaneous tissue was dissected down to the level of extensor mechanism and a medial parapatellar arthrotomy was undertaken.  Once this was completed, attention was turned towards the right knee where medial and lateral meniscus were removed, retropatellar fat pad, synovium in the anterior aspect of the femur, medial  and lateral meniscus, and the anterior and posterior cruciates.  Once this was done, attention was turned towards the femur where an intramedullary pilot hole was drilled, followed by a distal alignment guide with 3 degrees of valgus alignment with 9 mm of distal bone resected.  Following this, attention was turned after the distal resection to the sizing of the femur, it sized to a 7.  Anterior and posterior cuts were made, chamfers and box.  Attention was turned towards the tibia.  It is now cut perpendicular to its long axis with a saw and this allows for gap balancing of 6 mm spacer block was put in easily at this point.  Following this, attention was turned towards sizing the tibia, it sized to a 7, it was drilled and keeled and the trial components put in place.  A 7 trial was placed with a 7 femur, lugs were drilled for the femur.  The patella was cut down to a level of 13 mm.  A 41 paddle was chosen and lugs were drilled here.  Following this, the trial components were put in place.  The knee put through a range of motion.  Excellent stability and range of motion were achieved. Trials were removed.  The knee was then copiously irrigated and suctioned dry, and the final components were then cemented in place size 7 tibia, size 7 femur, a 41 mm all polyethylene patella, and a 7 mm bridging bearing trial was placed.  We then put the knee through a range of motion.  Hold in extension allowing the cement to completely harden. All excess bone cement was removed.  Tourniquet let down.  All bleeding was controlled with electrocautery.  Tested the 7 as really sluggish in extension, flexion was not bad, but it certainly is sluggish in extension.  At this point, we trialed a 6, although the 6 gave [place] better extension stability and was not bad in flexion, so at that point, we opened and chose a 6.  We placed it.  We then put the knee through a range of motion.  Excellent stability and range of  motion were achieved at this point.  Attention was then turned towards putting the final components in and the knee was now closed with 1 Vicryl running, the skin was closed with 0 and 2-0 Vicryl, and 3-0 Monocryl subcuticular. Benzoin and Steri-Strips were applied.  Sterile compressive dressing was applied and the patient was taken to the recovery room and was noted be in satisfactory condition.  Estimated blood loss for procedure is minimal.     Alta Corning, M.D.   ______________________________ Alta Corning, M.D.    Andres Roach  D:  07/07/2017  T:  07/07/2017  Job:  [id number]

## 2017-07-08 NOTE — Progress Notes (Signed)
07/08/17 1709  PT Visit Information  Last PT Received On 07/08/17  Assistance Needed +2 (for mobility progression )  History of Present Illness Pt is a 71 y/o male s/p elective R TKA. PMH includes skin cancer, HTN, and arthritis.   Subjective Data  Patient Stated Goal to decrease pain   Precautions  Precautions Knee  Precaution Booklet Issued Yes (comment)  Precaution Comments Progressed supine ther ex   Restrictions  Weight Bearing Restrictions Yes  RLE Weight Bearing WBAT  Pain Assessment  Pain Assessment Faces  Faces Pain Scale 6  Pain Location R knee   Pain Descriptors / Indicators Aching;Operative site guarding;Sore;Grimacing  Pain Intervention(s) Limited activity within patient's tolerance;Monitored during session;Repositioned  Cognition  Arousal/Alertness Awake/alert  Behavior During Therapy WFL for tasks assessed/performed  Overall Cognitive Status Within Functional Limits for tasks assessed  Bed Mobility  Overal bed mobility Needs Assistance  Bed Mobility Supine to Sit;Sit to Supine  Supine to sit Min assist  Sit to supine Min assist  General bed mobility comments Min A for RLE management. Instructed about hooking LLE under RLE to assist RLE movement. Used bed rails and elevated HOB.   Transfers  Overall transfer level Needs assistance  Equipment used Standard walker  Transfers Sit to/from Stand  Sit to Stand Max assist;From elevated surface  General transfer comment Max A for lift assist and steadying upon standing. Pt with heavy anterior lean upon standing. Required verbal cues for full upright posture. Pt wanting to pull up on RW and required verbal cues for appropriate hand placement. Elevated surface and required 2 attempts to stand.   Ambulation/Gait  Ambulation/Gait assistance Mod assist;Min assist  Ambulation Distance (Feet) 15 Feet  Assistive device Rolling walker (2 wheeled)  Gait Pattern/deviations Step-to pattern;Decreased step length - right;Decreased  step length - left;Decreased weight shift to right;Antalgic  General Gait Details Slow, antalgic gait. Improved distance from previous session, however, continues to be limited in distance secondary to pain. Verbal cues for increased WB through RLE. Required cues for upright posture and sequencing with RW as well. Pt with decreased proximity to device and required manual and verbal cues for appropriate proximity.   Gait velocity Decreased  Gait velocity interpretation Below normal speed for age/gender  Balance  Overall balance assessment Needs assistance  Sitting-balance support No upper extremity supported;Feet supported  Sitting balance-Leahy Scale Fair  Standing balance support No upper extremity supported;During functional activity  Standing balance-Leahy Scale Poor  Standing balance comment Required BUE support on RW.   General Comments  General comments (skin integrity, edema, etc.) MAx verbal cues to participate in gait training. Educated that he may need post acute rehab pending progress with mobility.   Exercises  Exercises Total Joint  Total Joint Exercises  Ankle Circles/Pumps AROM;Both;10 reps;Supine  Quad Sets AROM;Right;10 reps;Supine  Towel Squeeze AROM;Both;10 reps;Supine  Heel Slides AAROM;Right;10 reps;Other (comment);Supine  Short Arc Quad AAROM;Right;10 reps;Supine  Hip ABduction/ADduction AAROM;Right;10 reps;Supine  PT - End of Session  Equipment Utilized During Treatment Gait belt  Activity Tolerance Patient limited by pain  Patient left in bed;with call bell/phone within reach  Nurse Communication Mobility status  CPM Right Knee  CPM Right Knee Off  PT - Assessment/Plan  PT Plan Current plan remains appropriate  PT Visit Diagnosis Other abnormalities of gait and mobility (R26.89);Pain  Pain - Right/Left Right  Pain - part of body Knee  PT Frequency (ACUTE ONLY) 7X/week  Follow Up Recommendations DC plan and follow up therapy as arranged by  surgeon;Supervision  for mobility/OOB  PT equipment None recommended by PT  AM-PAC PT "6 Clicks" Daily Activity Outcome Measure  Difficulty turning over in bed (including adjusting bedclothes, sheets and blankets)? 1  Difficulty moving from lying on back to sitting on the side of the bed?  1  Difficulty sitting down on and standing up from a chair with arms (e.g., wheelchair, bedside commode, etc,.)? 1  Help needed moving to and from a bed to chair (including a wheelchair)? 2  Help needed walking in hospital room? 2  Help needed climbing 3-5 steps with a railing?  1  6 Click Score 8  Mobility G Code  CM  PT Goal Progression  Progress towards PT goals Progressing toward goals  Acute Rehab PT Goals  PT Goal Formulation With patient  Time For Goal Achievement 07/15/17  Potential to Achieve Goals Good  PT Time Calculation  PT Start Time (ACUTE ONLY) 1644  PT Stop Time (ACUTE ONLY) 1703  PT Time Calculation (min) (ACUTE ONLY) 19 min  PT General Charges  $$ ACUTE PT VISIT 1 Visit  PT Treatments  $Therapeutic Exercise 8-22 mins    Pt slowly progressing towards goals. Slight increase in gait and exercise tolerance from morning session, however, continuing to require mod to max A for mobility secondary to post op pain and weakness. Discussed possible need for post acute rehab pending pt progress, and pt understands, however, still wants to try to progress to home. Will continue to follow acutely to progress mobility and update recommendations as necessary.   Leighton Ruff, PT, DPT  Acute Rehabilitation Services  Pager: [phone]

## 2017-07-08 NOTE — Progress Notes (Signed)
Subjective: 1 Day Post-Op Procedure(s) (LRB): TOTAL KNEE ARTHROPLASTY (Right) Patient reports pain as moderate. Not out of bed yet. Taking food/fluids by mouth. Voiding okay.  Objective: Vital signs in last 24 hours: Temp:  [97.2 F (36.2 C)-99.3 F (37.4 C)] 99.3 F (37.4 C) (08/14 0028) Pulse Rate:  [58-76] 76 (08/14 0028) Resp:  [11-20] 16 (08/14 0028) BP: (108-159)/(76-90) 108/76 (08/14 0028) SpO2:  [94 %-100 %] 94 % (08/14 0028) Weight:  [117 kg (258 lb)] 117 kg (258 lb) (08/13 1243)  Intake/Output from previous day: 08/13 0701 - 08/14 0700 In: 2620 [P.O.:720; I.V.:1650; IV Piggyback:250] Out: 375 [Urine:250; Blood:125] Intake/Output this shift: No intake/output data recorded.  Right knee exam: Neurovascular intact Sensation intact distally Intact pulses distally Dorsiflexion/Plantar flexion intact Incision: dressing C/D/I Compartment soft  Assessment/Plan: 1 Day Post-Op Procedure(s) (LRB): TOTAL KNEE ARTHROPLASTY (Right)  Plan: Weight-bear as tolerated on right. Aspirin 325 mg twice daily for DVT prophylaxis with SCDs. Up with therapy Discharge home with home health tomorrow.  Andres Roach G 07/08/2017, 8:12 AM

## 2017-07-08 NOTE — Evaluation (Signed)
Occupational Therapy Evaluation Patient Details Name: Andres Roach MRN: [id number] DOB: 07-28-1946 Today's Date: 07/08/2017    History of Present Illness Pt is a 71 y/o male s/p elective R TKA. PMH includes skin cancer, HTN, and arthritis.    Clinical Impression   PTA, pt was living with his children and was independent. Currently, pt requires Mod A +2 for LB ADLs and functional mobility using RW. Provided education on LB ADLs; pt demonstrated understanding. Pt would benefit from further acute OT to increase safety and independence with ADLs and functional mobility.  Pending pt progress, pt may benefit from post-acute rehab for further OT to optimize his safety and independence prior to transitioning home.     Follow Up Recommendations  DC plan and follow up therapy as arranged by surgeon;Supervision/Assistance - 24 hour (Pending pt progress - may need SNF)    Equipment Recommendations  None recommended by OT    Recommendations for Other Services PT consult     Precautions / Restrictions Precautions Precautions: Knee Precaution Booklet Issued: No Precaution Comments: Reviewed supine ther ex with pt and placed pt in bonefoam at end of session  Restrictions Weight Bearing Restrictions: Yes RLE Weight Bearing: Weight bearing as tolerated      Mobility Bed Mobility Overal bed mobility: Needs Assistance Bed Mobility: Sit to Supine       Sit to supine: Mod assist;+2 for safety/equipment   General bed mobility comments: Mod A for RLE management.   Transfers Overall transfer level: Needs assistance Equipment used: Rolling walker (2 wheeled) Transfers: Sit to/from Stand Sit to Stand: Mod assist;+2 safety/equipment         General transfer comment: Mod A + 2 to power up into standing. Pt benefited from lowering RW to maintain standing balance.    Balance Overall balance assessment: Needs assistance Sitting-balance support: No upper extremity supported;Feet  supported Sitting balance-Leahy Scale: Fair Sitting balance - Comments: Donned pants   Standing balance support: No upper extremity supported;During functional activity Standing balance-Leahy Scale: Poor Standing balance comment: Requires Min A for standing balance without UE support during LB dressing                           ADL either performed or assessed with clinical judgement   ADL Overall ADL's : Needs assistance/impaired Eating/Feeding: Set up;Sitting   Grooming: Set up;Sitting   Upper Body Bathing: Set up;Sitting   Lower Body Bathing: Moderate assistance;Sit to/from stand;+2 for safety/equipment   Upper Body Dressing : Set up;Sitting   Lower Body Dressing: Moderate assistance;Sit to/from stand;+2 for safety/equipment Lower Body Dressing Details (indicate cue type and reason): Educated pt on LB dressing techniques. Pt donned pants with Mod A +2. Min A  to place L pant leg over L foot. Mod A +2 for sit<>stand. Pt required Min A  to maintain standing while pulling paints over hips Toilet Transfer: Moderate assistance;+2 for safety/equipment;Ambulation;RW (Simulated at recliner)           Functional mobility during ADLs: Moderate assistance;+2 for safety/equipment;Rolling walker General ADL Comments: Pt demonstrating decreased functional performance. Requiring Mod A +2 for LB ADLs and functional mobility due to pain. Pt may benefit from post acute rehab pending his progress.     Vision         Perception     Praxis      Pertinent Vitals/Pain Pain Assessment: 0-10 Pain Score: 4  (4/10 not moving) Faces Pain Scale: Hurts even  more Pain Location: R knee  Pain Descriptors / Indicators: Aching;Operative site guarding;Sore Pain Intervention(s): Limited activity within patient's tolerance;Monitored during session;Repositioned     Hand Dominance Right   Extremity/Trunk Assessment Upper Extremity Assessment Upper Extremity Assessment: Overall WFL for  tasks assessed   Lower Extremity Assessment Lower Extremity Assessment: Defer to PT evaluation   Cervical / Trunk Assessment Cervical / Trunk Assessment: Normal   Communication Communication Communication: HOH   Cognition Arousal/Alertness: Awake/alert Behavior During Therapy: WFL for tasks assessed/performed Overall Cognitive Status: Within Functional Limits for tasks assessed                                     General Comments  Discussed that pt may need post-acute rehab pending his progress. Pt stating he is motivated to [employer] home    Exercises Exercises: Total Joint Total Joint Exercises Heel Slides: AAROM;Right;10 reps;Seated;Other (comment) (leaning back in recliner)   Shoulder Instructions      Home Living Family/patient expects to be discharged to:: Private residence Living Arrangements: Children (Daughter (74) and son (70)) Available Help at Discharge: Family;Available 24 hours/day Type of Home: House Home Access: Stairs to enter [employer] of Steps: 2 Entrance Stairs-Rails: None Home Layout: Two level;Able to live on main level with bedroom/bathroom     Bathroom Shower/Tub: Tub/shower unit;Curtain   Bathroom Toilet: Standard     Home Equipment: Bedside commode;Walker - 2 wheels;Cane - single point;Electric scooter          Prior Functioning/Environment Level of Independence: Independent        Comments: ADLss, IADLs, Driving        OT Problem List: Decreased strength;Decreased range of motion;Decreased activity tolerance;Impaired balance (sitting and/or standing);Decreased safety awareness;Decreased knowledge of use of DME or AE;Decreased knowledge of precautions;Pain      OT Treatment/Interventions: Self-care/ADL training;Therapeutic exercise;Energy conservation;DME and/or AE instruction;Therapeutic activities;Patient/family education    OT Goals(Current goals can be found in the care plan section) Acute Rehab OT  Goals Patient Stated Goal: to decrease pain  OT Goal Formulation: With patient Time For Goal Achievement: 07/22/17 Potential to Achieve Goals: Good ADL Goals Pt Will Perform Lower Body Dressing: with min guard assist;sit to/from stand Pt Will Transfer to Toilet: with min guard assist;bedside commode;ambulating Pt Will Perform Tub/Shower Transfer: Tub transfer;ambulating;3 in 1;rolling walker;with min assist  OT Frequency: Min 2X/week   Barriers to D/C:            Co-evaluation              AM-PAC PT "6 Clicks" Daily Activity     Outcome Measure Help from another person eating meals?: None Help from another person taking care of personal grooming?: A Little Help from another person toileting, which includes using toliet, bedpan, or urinal?: A Lot Help from another person bathing (including washing, rinsing, drying)?: A Lot Help from another person to put on and taking off regular upper body clothing?: None Help from another person to put on and taking off regular lower body clothing?: A Lot 6 Click Score: 17   End of Session Equipment Utilized During Treatment: Gait belt;Rolling walker Nurse Communication: Mobility status;Weight bearing status;Precautions  Activity Tolerance: Patient limited by pain Patient left: in bed;with call bell/phone within reach  OT Visit Diagnosis: Unsteadiness on feet (R26.81);Other abnormalities of gait and mobility (R26.89);Pain;Muscle weakness (generalized) (M62.81) Pain - Right/Left: Right Pain - part of body: Knee  Time: 7425-9563 OT Time Calculation (min): 14 min Charges:  OT General Charges $OT Visit: 1 Procedure OT Evaluation $OT Eval Low Complexity: 1 Procedure G-Codes:     Ramin Zoll MSOT, OTR/L Acute Rehab Pager: [phone] Office: [place] 07/08/2017, 3:57 PM

## 2017-07-08 NOTE — NC FL2 (Signed)
[place] LEVEL OF CARE SCREENING TOOL     IDENTIFICATION  Patient Name: Andres Roach Birthdate: Nov 24, 1946 Sex: male Admission Date (Current Location): 07/07/2017  [facility] and [place] Number:  Herbalist and Address:  [facility] [place], [place] [address] [place], [place] [address]      Provider Number: [id number]  Attending Physician Name and Address:  Dorna Leitz, MD  Relative Name and Phone Number:  daughter, Algie Westry, [phone]    Current Level of Care: SNF Recommended Level of Care: [place] Prior Approval Number:    Date Approved/Denied: 07/08/17 PASRR Number: [id number]  Discharge Plan: SNF    Current Diagnoses: Patient Active Problem List   Diagnosis Date Noted  . Primary osteoarthritis of right knee 07/08/2017  . Osteoarthritis 07/07/2017    Orientation RESPIRATION BLADDER Height & Weight     Self, Time, Situation, Place  Normal Continent Weight: 258 lb (117 kg) Height:  6' (182.9 cm)  BEHAVIORAL SYMPTOMS/MOOD NEUROLOGICAL BOWEL NUTRITION STATUS      Continent Diet (See DC Summary)  AMBULATORY STATUS COMMUNICATION OF NEEDS Skin   Extensive Assist Verbally Surgical wounds (Right Knee Closed Incision with Compression Wrap)                       Personal Care Assistance Level of Assistance              Functional Limitations Info  Hearing   Hearing Info: Impaired      SPECIAL CARE FACTORS FREQUENCY  PT (By licensed PT)     PT Frequency: 7x week              Contractures      Additional Factors Info  Code Status, Allergies Code Status Info: Full Code Allergies Info: No Known Allergies           Current Medications (07/08/2017):  This is the current hospital active medication list Current Facility-Administered Medications  Medication Dose Route Frequency Provider Last Rate Last Dose  . acetaminophen (TYLENOL) tablet 650 mg  650 mg Oral Q6H PRN Dorna Leitz,  MD       Or  . acetaminophen (TYLENOL) suppository 650 mg  650 mg Rectal Q6H PRN Dorna Leitz, MD      . alum & mag hydroxide-simeth (MAALOX/MYLANTA) 200-200-20 MG/5ML suspension 30 mL  30 mL Oral Q4H PRN Dorna Leitz, MD      . amLODipine (NORVASC) tablet 5 mg  5 mg Oral Daily Dorna Leitz, MD      . aspirin EC tablet 325 mg  325 mg Oral BID Dorna Leitz, MD   325 mg at 07/08/17 0941  . bisacodyl (DULCOLAX) suppository 10 mg  10 mg Rectal Daily PRN Dorna Leitz, MD      . celecoxib (CELEBREX) capsule 200 mg  200 mg Oral Q12H Dorna Leitz, MD   200 mg at 07/08/17 0941  . diphenhydrAMINE (BENADRYL) 12.5 MG/5ML elixir 12.5-25 mg  12.5-25 mg Oral Q4H PRN Dorna Leitz, MD      . gabapentin (NEURONTIN) capsule 400 mg  400 mg Oral TID Dorna Leitz, MD   400 mg at 07/08/17 0941  . lisinopril (PRINIVIL,ZESTRIL) tablet 20 mg  20 mg Oral Daily Dorna Leitz, MD       And  . hydrochlorothiazide (MICROZIDE) capsule 12.5 mg  12.5 mg Oral Daily Dorna Leitz, MD      . lactated ringers infusion   Intravenous Continuous Ola Spurr,  Gwyndolyn Saxon, MD 50 mL/hr at 07/07/17 1240    . menthol-cetylpyridinium (CEPACOL) lozenge 3 mg  1 lozenge Oral PRN Dorna Leitz, MD       Or  . phenol (CHLORASEPTIC) mouth spray 1 spray  1 spray Mouth/Throat PRN Dorna Leitz, MD      . methocarbamol (ROBAXIN) tablet 500 mg  500 mg Oral Q6H PRN Dorna Leitz, MD   500 mg at 07/08/17 1310   Or  . methocarbamol (ROBAXIN) 500 mg in dextrose 5 % 50 mL IVPB  500 mg Intravenous Q6H PRN Dorna Leitz, MD      . metoCLOPramide (REGLAN) tablet 5-10 mg  5-10 mg Oral Q8H PRN Dorna Leitz, MD       Or  . metoCLOPramide (REGLAN) injection 5-10 mg  5-10 mg Intravenous Q8H PRN Dorna Leitz, MD      . morphine 4 MG/ML injection 1-2 mg  1-2 mg Intravenous Q2H PRN Dorna Leitz, MD   2 mg at 07/08/17 1128  . ondansetron (ZOFRAN) tablet 4 mg  4 mg Oral Q6H PRN Dorna Leitz, MD       Or  . ondansetron (ZOFRAN) injection 4 mg  4 mg Intravenous Q6H PRN Dorna Leitz, MD      . oxyCODONE (Oxy IR/ROXICODONE) immediate release tablet 5-10 mg  5-10 mg Oral Q3H PRN Dorna Leitz, MD   10 mg at 07/08/17 1310  . tranexamic acid (CYKLOKAPRON) 1,000 mg in sodium chloride 0.9 % 100 mL IVPB  1,000 mg Intravenous Once Dorna Leitz, MD      . zolpidem (AMBIEN) tablet 5 mg  5 mg Oral QHS PRN Dorna Leitz, MD         Discharge Medications: Please see discharge summary for a list of discharge medications.  Relevant Imaging Results:  Relevant Lab Results:   Additional Information SS#:[id number] [place], LCSW

## 2017-07-09 MED ORDER — ASPIRIN EC 325 MG PO TBEC: 325.0000 mg | DELAYED_RELEASE_TABLET | Freq: Two times a day (BID) | ORAL | 0 refills | Status: AC

## 2017-07-09 MED ORDER — TIZANIDINE HCL 2 MG PO TABS: 2.0000 mg | ORAL_TABLET | Freq: Three times a day (TID) | ORAL | 0 refills | Status: AC | PRN

## 2017-07-09 MED ORDER — OXYCODONE HCL 10 MG PO TABS: 10.0000 mg | ORAL_TABLET | ORAL | 0 refills | Status: AC | PRN

## 2017-07-09 MED ORDER — DOCUSATE SODIUM 100 MG PO CAPS: 100.0000 mg | ORAL_CAPSULE | Freq: Two times a day (BID) | ORAL | 0 refills | Status: AC

## 2017-07-09 NOTE — Social Work (Addendum)
Patient has received Insurance [id number] from [facility], approved for 3 days, next review 07/11/17. [place] [facility] [place] [phone] ask for pre-cert coordinator.  CSW will f/u for DC.   SNF advised of Insurance Auth.  Elissa Hefty, LCSW Clinical Social Worker [phone]

## 2017-07-09 NOTE — Progress Notes (Signed)
Physical Therapy Treatment Patient Details Name: Andres Roach MRN: [id number] DOB: May 09, 1946 Today's Date: 07/09/2017    History of Present Illness Pt is a 71 y/o male s/p elective R TKA. PMH includes skin cancer, HTN, and arthritis.     PT Comments    Pt very limited due to pain, fatigue, and weakness. Pt min guard with bed mobility, Mod assist for transfers with boost into standing and mod cues, Mod assist for gait with max cues and physical assistance. Pt began to bleed during gait training, sat pt down in reclining chair and rolled pt back to room to have nurse redress wound. Spoke with PA after am session, will f/u after pm session. Next Tx pt to gait train with less assistance and cues while still in acute care setting.     Follow Up Recommendations  DC plan and follow up therapy as arranged by surgeon;Supervision for mobility/OOB     Equipment Recommendations  None recommended by PT    Recommendations for Other Services       Precautions / Restrictions Precautions Precautions: Knee Precaution Comments: Reviewed no pillow under knee Restrictions Weight Bearing Restrictions: Yes RLE Weight Bearing: Weight bearing as tolerated    Mobility  Bed Mobility Overal bed mobility: Needs Assistance Bed Mobility: Supine to Sit     Supine to sit: Min guard;HOB elevated     General bed mobility comments: Pt OOB in chair upon arrival  Transfers Overall transfer level: Needs assistance Equipment used: Rolling walker (2 wheeled) Transfers: Sit to/from Stand Sit to Stand: Min assist;Mod assist         General transfer comment: Mod assist to boost up from chair x1, min assist from [facility] x2. Good hand placement and technique, incresed time required  Ambulation/Gait Ambulation/Gait assistance: Mod assist (Heavy mod assist ) Ambulation Distance (Feet): 100 Feet Assistive device: Rolling walker (2 wheeled) Gait Pattern/deviations: Step-to pattern;Decreased weight shift to  right;Decreased dorsiflexion - right;Decreased dorsiflexion - left;Trunk flexed Gait velocity: Decreased   General Gait Details: Pt heavy mod assist for gait with physical assistance and max VC's for staying inside walker for safety, Weightshift to RLE, heel strike/ toe off, multiple reminders for posture. Pt C/O LLE being "as bad off as his RLE". Pt began to bleed from incision during gait training, sat pt in reclining chair and rolled back to room. Notified nurse of issue, nurse wrapped incision, SPTA applied ice to R knee   Stairs            Wheelchair Mobility    Modified Rankin (Stroke Patients Only)       Balance Overall balance assessment: Needs assistance Sitting-balance support: No upper extremity supported;Feet supported Sitting balance-Leahy Scale: Good     Standing balance support: Single extremity supported;During functional activity Standing balance-Leahy Scale: Poor Standing balance comment: min assist for balance and support                            Cognition Arousal/Alertness: Awake/alert Behavior During Therapy: WFL for tasks assessed/performed Overall Cognitive Status: Within Functional Limits for tasks assessed                                        Exercises Total Joint Exercises Ankle Circles/Pumps: AROM;Both;10 reps;Seated Straight Leg Raises: AROM;Left;Seated;10 reps    General Comments  Pertinent Vitals/Pain Pain Assessment: 0-10 Pain Score: 7  Pain Location: R knee Pain Descriptors / Indicators: Aching;Grimacing Pain Intervention(s): Monitored during session;Premedicated before session;Ice applied    Home Living                      Prior Function            PT Goals (current goals can now be found in the care plan section) Acute Rehab PT Goals Patient Stated Goal: To go home PT Goal Formulation: With patient Potential to Achieve Goals: Good Progress towards PT goals: Progressing  toward goals    Frequency    7X/week      PT Plan Current plan remains appropriate    Co-evaluation              AM-PAC PT "6 Clicks" Daily Activity  Outcome Measure  Difficulty turning over in bed (including adjusting bedclothes, sheets and blankets)?: None Difficulty moving from lying on back to sitting on the side of the bed? : A Little Difficulty sitting down on and standing up from a chair with arms (e.g., wheelchair, bedside commode, etc,.)?: Total Help needed moving to and from a bed to chair (including a wheelchair)?: A Lot Help needed walking in hospital room?: A Lot Help needed climbing 3-5 steps with a railing? : Total 6 Click Score: 13    End of Session Equipment Utilized During Treatment: Gait belt Activity Tolerance: Patient limited by pain Patient left: with call bell/phone within reach;in chair Nurse Communication: Mobility status;Other (comment) (Bleeding from incision) PT Visit Diagnosis: Other abnormalities of gait and mobility (R26.89);Pain Pain - Right/Left: Right Pain - part of body: Knee     Time: 3329-5188 PT Time Calculation (min) (ACUTE ONLY): 31 min  Charges:  $Gait Training: 8-22 mins $Therapeutic Activity: 8-22 mins                    G Codes:       Avni Traore, SPTA [id number]    Tamikka Pilger 07/09/2017, 3:34 PM

## 2017-07-09 NOTE — Progress Notes (Signed)
Occupational Therapy Treatment Patient Details Name: Andres Roach MRN: [id number] DOB: 1946/04/24 Today's Date: 07/09/2017    History of present illness Pt is a 71 y/o male s/p elective R TKA. PMH includes skin cancer, HTN, and arthritis.    OT comments  Pt making gradual progress toward OT goals. Able to perform functional mobility to bathroom for bathing with min-mod assist. Required min assist overall for UB/LB bathing and dressing. D/c plan remains appropriate. Will continue to follow acutely.   Follow Up Recommendations  DC plan and follow up therapy as arranged by surgeon;Supervision/Assistance - 24 hour    Equipment Recommendations  None recommended by OT    Recommendations for Other Services      Precautions / Restrictions Precautions Precautions: Knee Precaution Comments: Reviewed no pillow under knee Restrictions Weight Bearing Restrictions: Yes RLE Weight Bearing: Weight bearing as tolerated       Mobility Bed Mobility Overal bed mobility: Needs Assistance Bed Mobility: Supine to Sit     Supine to sit: Min guard;HOB elevated     General bed mobility comments: Pt OOB in chair upon arrival  Transfers Overall transfer level: Needs assistance Equipment used: Rolling walker (2 wheeled) Transfers: Sit to/from Stand Sit to Stand: Min assist;Mod assist         General transfer comment: Mod assist to boost up from chair x1, min assist from [facility] x2. Good hand placement and technique, incresed time required    Balance Overall balance assessment: Needs assistance Sitting-balance support: No upper extremity supported;Feet supported Sitting balance-Leahy Scale: Good     Standing balance support: Single extremity supported;During functional activity Standing balance-Leahy Scale: Poor Standing balance comment: min assist for balance and support                           ADL either performed or assessed with clinical judgement   ADL Overall ADL's  : Needs assistance/impaired         Upper Body Bathing: Minimal assistance;Sitting Upper Body Bathing Details (indicate cue type and reason): assist for back Lower Body Bathing: Minimal assistance;Sit to/from stand Lower Body Bathing Details (indicate cue type and reason): assist for standing balance Upper Body Dressing : Set up;Supervision/safety;Sitting   Lower Body Dressing: Minimal assistance;Sit to/from stand Lower Body Dressing Details (indicate cue type and reason): Light min assist for starting underwear over R foot and for balance in standing Toilet Transfer: Minimal assistance;Ambulation;BSC;RW           Functional mobility during ADLs: Minimal assistance;Rolling walker General ADL Comments: DOE 3/4; SpO2 89% on RA.     Vision       Perception     Praxis      Cognition Arousal/Alertness: Awake/alert Behavior During Therapy: WFL for tasks assessed/performed Overall Cognitive Status: Within Functional Limits for tasks assessed                                          Exercises Total Joint Exercises Ankle Circles/Pumps: AROM;Both;10 reps;Seated Straight Leg Raises: AROM;Left;Seated;10 reps   Shoulder Instructions       General Comments      Pertinent Vitals/ Pain       Pain Assessment: 0-10 Pain Score: 7  Pain Location: R knee Pain Descriptors / Indicators: Aching;Grimacing Pain Intervention(s): Monitored during session;Premedicated before session;Ice applied  Home Living  Prior Functioning/Environment              Frequency  Min 2X/week        Progress Toward Goals  OT Goals(current goals can now be found in the care plan section)  Progress towards OT goals: Progressing toward goals  Acute Rehab OT Goals Patient Stated Goal: To go home OT Goal Formulation: With patient  Plan Discharge plan remains appropriate    Co-evaluation                  AM-PAC PT "6 Clicks" Daily Activity     Outcome Measure   Help from another person eating meals?: None Help from another person taking care of personal grooming?: A Little Help from another person toileting, which includes using toliet, bedpan, or urinal?: A Lot Help from another person bathing (including washing, rinsing, drying)?: A Little Help from another person to put on and taking off regular upper body clothing?: None Help from another person to put on and taking off regular lower body clothing?: A Little 6 Click Score: 19    End of Session Equipment Utilized During Treatment: Gait belt;Rolling walker  OT Visit Diagnosis: Unsteadiness on feet (R26.81);Other abnormalities of gait and mobility (R26.89);Pain;Muscle weakness (generalized) (M62.81) Pain - Right/Left: Right Pain - part of body: Knee   Activity Tolerance Patient tolerated treatment well   Patient Left in chair;with call bell/phone within reach   Nurse Communication          Time: 8937-3428 OT Time Calculation (min): 24 min  Charges: OT General Charges $OT Visit: 1 Procedure OT Treatments $Self Care/Home Management : 23-37 mins  Marcie Shearon A. Ulice Brilliant, M.S., OTR/L Pager: [place] 07/09/2017, 3:15 PM

## 2017-07-09 NOTE — Care Management Note (Signed)
Case Management Note  Patient Details  Name: Andres Roach MRN: [id number] Date of Birth: [birth year]  Subjective/Objective:    71 yr old male s/p right total knee arthroplasty.                Action/Plan: Patient was preoperatively setup with [facility], no changes. He will have family support at discharge.   Expected Discharge Date:  07/09/17               Expected Discharge Plan:  [place]  In-House Referral:     Discharge planning Services  CM Consult  Post Acute Care Choice:  Home Health, Durable Medical Equipment Choice offered to:     DME Arranged:  3-N-1, CPM, Walker rolling DME Agency:  [employer]  HH Arranged:  PT [place]:  [place]  Status of Service:  Completed, signed off  If discussed at [place] of Stay Meetings, dates discussed:    Additional Comments:  Ninfa Meeker, RN 07/09/2017, 3:16 PM

## 2017-07-09 NOTE — Social Work (Signed)
CSW was advised patient decided to go home with home services.  CSW will sign off for now as social work intervention is no longer needed. Please consult [place] again if new need arises.  Elissa Hefty, LCSW Clinical Social Worker [phone]

## 2017-07-09 NOTE — Progress Notes (Addendum)
Subjective: 2 Days Post-Op Procedure(s) (LRB): TOTAL KNEE ARTHROPLASTY (Right) Patient reports pain as mild. Taking by mouth and voiding okay. Reports that he ambulated in the room. He wants to go home with home health physical therapy.   Objective: Vital signs in last 24 hours: Temp:  [98.5 F (36.9 C)-98.7 F (37.1 C)] 98.7 F (37.1 C) (08/15 0534) Pulse Rate:  [76-90] 90 (08/15 0534) Resp:  [16-18] 18 (08/15 0534) BP: (102-148)/(70-98) 116/79 (08/15 0534) SpO2:  [92 %-98 %] 92 % (08/15 0534)  Intake/Output from previous day: 08/14 0701 - 08/15 0700 In: 960 [P.O.:960] Out: 700 [Urine:700] Intake/Output this shift: Total I/O In: 480 [P.O.:480] Out: 400 [Urine:400]  Right knee exam: Aquacell dressing has some areas of saturation with blood. The dressing is removed his wound is benign. His calf is mildly swollen but nontender.Neurovascular status is intact distally. Good ankle plantar and dorsiflexor strength.   Assessment/Plan: 2 Days Post-Op Procedure(s) (LRB): TOTAL KNEE ARTHROPLASTY (Right) Plan: Aquacel dressing changed. Needs physical therapy this afternoon and then will be ready for discharge to home with home health physical therapy. Rx for Percocet, tizanidine, aspirin 325 mg twice daily 1 month,and Colace. Weight-bear as tolerated on right. I spoke at length with the patient. He wants to go home with home health physical therapy. He reports that he has plenty of family support there. Follow-up with Dr. Berenice Primas in 2 weeks.  Andres Roach 07/09/2017, 10:38 AM

## 2017-07-09 NOTE — Discharge Summary (Addendum)
Patient ID: Andres Roach MRN: [id number] DOB/AGE: September 12, 1946 71 y.o.  Admit date: 07/07/2017 Discharge date: 07/10/2017  Admission Diagnoses:  Principal Problem:   Primary osteoarthritis of right knee Active Problems:   Osteoarthritis   Discharge Diagnoses:  Same  Past Medical History:  Diagnosis Date  . Arthritis   . 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 History of kidney stones   . Hypertension     Surgeries: Procedure(s): TOTAL KNEE ARTHROPLASTY on 07/07/2017   Consultants:   Discharged Condition: Improved  Hospital Course: Andres Roach is an 71 y.o. male who was admitted 07/07/2017 for operative treatment ofPrimary osteoarthritis of right knee. Patient has severe unremitting pain that affects sleep, daily activities, and work/hobbies. After pre-op clearance the patient was taken to the operating room on 07/07/2017 and underwent  Procedure(s):right TOTAL KNEE ARTHROPLASTY.    Patient was given perioperative antibiotics:  Anti-infectives    Start     Dose/Rate Route Frequency Ordered Stop   07/07/17 1830  ceFAZolin (ANCEF) IVPB 2g/100 mL premix     2 g 200 mL/hr over 30 Minutes Intravenous Every 6 hours 07/07/17 1817 07/08/17 0451   07/07/17 1300  ceFAZolin (ANCEF) IVPB 2g/100 mL premix     2 g 200 mL/hr over 30 Minutes Intravenous To [facility] 07/04/17 1318 07/07/17 1342       Patient was given sequential compression devices, early ambulation, and chemoprophylaxis to prevent DVT.  Patient benefited maximally from hospital stay and there were no complications. The patient was going to be discharged on postop day #2,, but physical therapy did not feel it was safe for him to be discharged home at that time he was kept an additional night so that he can get additional physical therapy that was needed for his safety. This would reduce his risk of falling and have further costly problems.  Recent vital signs:  Patient Vitals for the past 24 hrs:  BP  Temp Temp src Pulse Resp SpO2  07/10/17 0630 115/73 98.1 F (36.7 C) Oral 77 18 99 %  07/09/17 2034 116/75 98.6 F (37 C) Oral 85 18 97 %     Recent laboratory studies: No results for input(s): WBC, HGB, HCT, PLT, NA, K, CL, CO2, BUN, CREATININE, GLUCOSE, INR, CALCIUM in the last 72 hours.  Invalid input(s): PT, 2   Discharge Medications:   Allergies as of 07/10/2017      Reactions   No Known Allergies       Medication List    STOP taking these medications   diclofenac 75 MG EC tablet Commonly known as:  VOLTAREN     TAKE these medications   amLODipine 5 MG tablet Commonly known as:  NORVASC Take 5 mg by mouth daily.   aspirin EC 325 MG tablet Take 1 tablet (325 mg total) by mouth 2 (two) times daily after a meal. Take x 1 month post op to decrease risk of blood clots.   docusate sodium 100 MG capsule Commonly known as:  COLACE Take 1 capsule (100 mg total) by mouth 2 (two) times daily.   gabapentin 400 MG capsule Commonly known as:  NEURONTIN Take 400 mg by mouth 3 (three) times daily.   lisinopril-hydrochlorothiazide 20-12.5 MG tablet Commonly known as:  PRINZIDE,ZESTORETIC Take 2 tablets by mouth every morning.   Oxycodone HCl 10 MG Tabs Take 1 tablet (10 mg total) by mouth every 4 (four) hours as needed (pain). What changed:  reasons  to take this   tiZANidine 2 MG tablet Commonly known as:  ZANAFLEX Take 1 tablet (2 mg total) by mouth every 8 (eight) hours as needed for muscle spasms.       Diagnostic Studies: Dg Chest 2 View  Result Date: 07/01/2017 CLINICAL DATA:  Pre-op films for right knee replacement surgery, no chest complaints or conditions. EXAM: CHEST - 2 VIEW COMPARISON:  none FINDINGS: Linear scarring or subsegmental atelectasis medially at the right lung base. Lungs otherwise clear. Heart size and mediastinal contours are within normal limits. No effusion. Visualized bones unremarkable. IMPRESSION: Minimal right lower lobe linear scarring or  atelectasis. In the absence of prior studies demonstrating stability, consider follow-up film to confirm appropriate resolution. Electronically Signed   By: Lucrezia Europe M.D.   On: 07/01/2017 10:35    Disposition: home with home health physical therapy  Discharge Instructions    CPM    Complete by:  As directed    Continuous passive motion machine (CPM):      Use the CPM from 0 degrees to 70 degrees for 6 hours per day.      You may increase by 5 degrees per day.  You may break it up into 2 or 3 sessions per day.      Use CPM for 1-2 weeks or until you are told to stop.   Call MD / Call 911    Complete by:  As directed    If you experience chest pain or shortness of breath, CALL 911 and be transported to the hospital emergency room.  If you develope a fever above 101 F, pus (white drainage) or increased drainage or redness at the wound, or calf pain, call your surgeon's office.   Constipation Prevention    Complete by:  As directed    Drink plenty of fluids.  Prune juice may be helpful.  You may use a stool softener, such as Colace (over the counter) 100 mg twice a day.  Use MiraLax (over the counter) for constipation as needed.   Do not put a pillow under the knee. Place it under the heel.    Complete by:  As directed    Increase activity slowly as tolerated    Complete by:  As directed    Weight bearing as tolerated    Complete by:  As directed    Laterality:  right   Extremity:  Lower       Contact information for follow-up providers    Dorna Leitz, MD. Schedule an appointment as soon as possible for a visit in 2 week(s).   Specialty:  Orthopedic Surgery Contact information: [place] [address] [address], [facility].   Why:  A representative from [facility] will contact you to arrange start date and time for your therapy. Contact information: [address] [place] [address] [phone]             Contact information for after-discharge care    [facility] .   Specialty:  [place] Contact information: [address] [place] [phone]                   Signed: Erlene Senters 07/10/2017, 8:50 AM

## 2017-07-09 NOTE — Discharge Instructions (Signed)

## 2017-07-09 NOTE — Progress Notes (Signed)
Physical Therapy Treatment Patient Details Name: Andres Roach MRN: [id number] DOB: [birth year] Today's Date: 07/09/2017    History of Present Illness Pt is a 71 y/o male s/p elective R TKA. PMH includes skin cancer, HTN, and arthritis.     PT Comments    Pt tolerated Tx well some limitations due to weakness. Pt Mod assist for transfers with required boost into standing and increased time, pt min guard for gait with no physical assist and minimal cues. Pt Min assist with stair training and required no VC's after ascending/descending stairs the first time. Pt remains limited during transfers and will continue to require physical assist at home if D/C today. Pt remains at risk for fall due to safety concerns. Spoke with Ortho PA Gary Fleet) about pt's slow progression, PA reports D/C plans are for tomorrow and is to be seen first thing in the am. Next Tx for pt to practice transfer to decrease assistance required for safety and continue exercise program for LE strengthening.    Follow Up Recommendations  DC plan and follow up therapy as arranged by surgeon;Supervision for mobility/OOB     Equipment Recommendations  None recommended by PT    Recommendations for Other Services       Precautions / Restrictions Precautions Precautions: Knee Precaution Comments: Reviewed no pillow under knee Restrictions Weight Bearing Restrictions: Yes RLE Weight Bearing: Weight bearing as tolerated    Mobility  Bed Mobility        General bed mobility comments: Pt OOB in chair upon arrival  Transfers Overall transfer level: Needs assistance Equipment used: Rolling walker (2 wheeled) Transfers: Sit to/from Stand Sit to Stand: Mod assist         General transfer comment: Mod assist requiring boost into standing, increased time. cue for RLE advancement  Ambulation/Gait Ambulation/Gait assistance: Min guard Ambulation Distance (Feet): 100 Feet Assistive device: Rolling walker (2  wheeled) Gait Pattern/deviations: Step-to pattern;Decreased dorsiflexion - right;Trunk flexed Gait velocity: Decreased   General Gait Details: Pt min guard for gait no physical assistance required, VC's for hand placement, posture and heel strike   Stairs Stairs: Yes  Min assist Stair Management: No rails;Backwards;With walker Number of Stairs: 4 General stair comments: Pt did well and needed no cues after initial 2 practice steps   Wheelchair Mobility    Modified Rankin (Stroke Patients Only)       Balance Overall balance assessment: Needs assistance Sitting-balance support: No upper extremity supported;Feet supported Sitting balance-Leahy Scale: Good     Standing balance support: During functional activity;Bilateral upper extremity supported Standing balance-Leahy Scale: Fair Standing balance comment: No UE support in static standing, UE support requried with activity                             Cognition Arousal/Alertness: Awake/alert Behavior During Therapy: WFL for tasks assessed/performed Overall Cognitive Status: Within Functional Limits for tasks assessed                                        Exercises Total Joint Exercises Ankle Circles/Pumps: AROM;Both;10 reps;Seated Straight Leg Raises: AROM;Left;Seated;10 reps Goniometric ROM: 24-84 R Knee    General Comments        Pertinent Vitals/Pain Pain Assessment: 0-10 Pain Score: 5  Pain Location: R knee Pain Descriptors / Indicators: Aching;Grimacing;Discomfort Pain Intervention(s): Limited activity within patient's tolerance;Monitored  during session;Premedicated before session;Repositioned;Ice applied    Home Living                      Prior Function            PT Goals (current goals can now be found in the care plan section) Acute Rehab PT Goals Patient Stated Goal: To go home PT Goal Formulation: With patient Potential to Achieve Goals: Good Progress  towards PT goals: Progressing toward goals    Frequency    7X/week      PT Plan Current plan remains appropriate    Co-evaluation              AM-PAC PT "6 Clicks" Daily Activity  Outcome Measure  Difficulty turning over in bed (including adjusting bedclothes, sheets and blankets)?: None Difficulty moving from lying on back to sitting on the side of the bed? : A Little Difficulty sitting down on and standing up from a chair with arms (e.g., wheelchair, bedside commode, etc,.)?: Total Help needed moving to and from a bed to chair (including a wheelchair)?: A Lot Help needed walking in hospital room?: A Little Help needed climbing 3-5 steps with a railing? : A Little 6 Click Score: 16    End of Session Equipment Utilized During Treatment: Gait belt Activity Tolerance: Patient tolerated treatment well Patient left: in chair;with call bell/phone within reach Nurse Communication: Mobility status PT Visit Diagnosis: Other abnormalities of gait and mobility (R26.89);Pain Pain - Right/Left: Right Pain - part of body: Knee     Time: 1538-1610 PT Time Calculation (min) (ACUTE ONLY): 32 min  Charges:  $Gait Training: 8-22 mins $Therapeutic Activity: 8-22 mins                    G Codes:       Lexton Hidalgo, SPTA [id number]    Alexiana Laverdure 07/09/2017, 4:35 PM

## 2017-07-10 LAB — GLUCOSE, CAPILLARY: Glucose-Capillary: 122 mg/dL — ABNORMAL HIGH (ref 65–99)

## 2017-07-10 NOTE — Progress Notes (Signed)
Subjective: 3 Days Post-Op Procedure(s) (LRB): TOTAL KNEE ARTHROPLASTY (Right) Patient reports pain as mild.    Objective: Vital signs in last 24 hours: Temp:  [98.1 F (36.7 C)-98.6 F (37 C)] 98.1 F (36.7 C) (08/16 0630) Pulse Rate:  [77-85] 77 (08/16 0630) Resp:  [18] 18 (08/16 0630) BP: (115-116)/(73-75) 115/73 (08/16 0630) SpO2:  [97 %-99 %] 99 % (08/16 0630)  Intake/Output from previous day: 08/15 0701 - 08/16 0700 In: 1320 [P.O.:1320] Out: 3000 [Urine:3000] Intake/Output this shift: No intake/output data recorded.  No results for input(s): HGB in the last 72 hours. No results for input(s): WBC, RBC, HCT, PLT in the last 72 hours. No results for input(s): NA, K, CL, CO2, BUN, CREATININE, GLUCOSE, CALCIUM in the last 72 hours. No results for input(s): LABPT, INR in the last 72 hours.  Neurologically intact ABD soft Neurovascular intact Sensation intact distally Intact pulses distally Dorsiflexion/Plantar flexion intact No cellulitis present Compartment soft  Assessment/Plan: 3 Days Post-Op Procedure(s) (LRB): TOTAL KNEE ARTHROPLASTY (Right) Advance diet Up with therapy Discharge home with home health  Chiyoko Torrico L 07/10/2017, 8:11 AM

## 2017-07-10 NOTE — Progress Notes (Signed)
Physical Therapy Treatment Patient Details Name: Andres Roach MRN: [id number] DOB: 1946-08-30 Today's Date: 07/10/2017    History of Present Illness Pt is a 71 y/o male s/p elective R TKA. PMH includes skin cancer, HTN, and arthritis.     PT Comments    Pt performed well during pm tx but limited due to pain. Pt able increase ambulation distance however still requires a follow chair due to fatiguing fast. Pt unable to tolerate bone foam for longer than 30 minutes a day, although SPTA reiterated and reviewed the importance of it. Pt's R knee extension still lacks 20 degrees. HHPT remains appropriate to assist in R knee ROM and overall functional mobility.   Follow Up Recommendations  DC plan and follow up therapy as arranged by surgeon;Supervision for mobility/OOB     Equipment Recommendations  None recommended by PT    Recommendations for Other Services       Precautions / Restrictions Precautions Precautions: Knee Precaution Booklet Issued: Yes (comment) Precaution Comments: Reviewed importance of bone foam for R knee extension.  Restrictions Weight Bearing Restrictions: Yes RLE Weight Bearing: Weight bearing as tolerated    Mobility  Bed Mobility Overal bed mobility: Needs Assistance Bed Mobility: Supine to Sit     Supine to sit: Supervision Sit to supine: Min assist   General bed mobility comments: Pt demonstrated safe bed mobility with no cueing needed, and safe self assist to advance RLE to EOB.  Transfers Overall transfer level: Needs assistance Equipment used: Rolling walker (2 wheeled) Transfers: Sit to/from Stand Sit to Stand: Min assist         General transfer comment: Pt requires min assist to get from seated position to standing. Min assist for balance and safety during functional mobility.   Ambulation/Gait Ambulation/Gait assistance: Min guard Ambulation Distance (Feet): 236 Feet (Required seated rest break at 100'. ) Assistive device: Rolling  walker (2 wheeled) Gait Pattern/deviations: Step-to pattern;Step-through pattern;Decreased step length - left;Decreased stance time - right;Decreased stride length;Decreased dorsiflexion - right;Decreased weight shift to right;Antalgic;Trunk flexed Gait velocity: Decreased   General Gait Details: Pt min guard for stability, initailly step to pattern then able to perform step through with max cueing to increase step length on LLE and to increase R dorsiflexion to assist in heel strike and knee extension on RLE.   Stairs            Wheelchair Mobility    Modified Rankin (Stroke Patients Only)       Balance Overall balance assessment: Needs assistance Sitting-balance support: No upper extremity supported;Feet supported Sitting balance-Leahy Scale: Good     Standing balance support: Bilateral upper extremity supported;During functional activity   Standing balance comment: Pt able to stand and adjust LB clothes with one hand supported on the RW.                             Cognition Arousal/Alertness: Awake/alert Behavior During Therapy: WFL for tasks assessed/performed Overall Cognitive Status: Within Functional Limits for tasks assessed                                 General Comments: Pt in bed with R knee slightly flexed.       Exercises Total Joint Exercises Ankle Circles/Pumps: AROM;Both;20 reps;Supine Quad Sets: AROM;Right;10 reps;Seated Short Arc Quad: AAROM;Right;10 reps;Supine Straight Leg Raises: AAROM;Right;10 reps;Seated    General Comments  Pertinent Vitals/Pain Pain Assessment: 0-10 Pain Score: 6  Pain Location: R knee Pain Descriptors / Indicators: Sharp Pain Intervention(s): Limited activity within patient's tolerance;Monitored during session;Ice applied    Home Living                      Prior Function            PT Goals (current goals can now be found in the care plan section) Acute Rehab PT  Goals Patient Stated Goal: To go home PT Goal Formulation: With patient Potential to Achieve Goals: Fair Progress towards PT goals: Progressing toward goals    Frequency    7X/week      PT Plan Current plan remains appropriate    Co-evaluation              AM-PAC PT "6 Clicks" Daily Activity  Outcome Measure  Difficulty turning over in bed (including adjusting bedclothes, sheets and blankets)?: None Difficulty moving from lying on back to sitting on the side of the bed? : A Little Difficulty sitting down on and standing up from a chair with arms (e.g., wheelchair, bedside commode, etc,.)?: A Lot Help needed moving to and from a bed to chair (including a wheelchair)?: A Lot Help needed walking in hospital room?: A Little Help needed climbing 3-5 steps with a railing? : A Little 6 Click Score: 17    End of Session Equipment Utilized During Treatment: Gait belt Activity Tolerance: Patient tolerated treatment well Patient left: in bed;with call bell/phone within reach Nurse Communication: Mobility status PT Visit Diagnosis: Other abnormalities of gait and mobility (R26.89);Pain Pain - Right/Left: Right Pain - part of body: Knee     Time: 2620-3559 PT Time Calculation (min) (ACUTE ONLY): 39 min  Charges:  $Gait Training: 8-22 mins $Therapeutic Exercise: 8-22 mins $Therapeutic Activity: 8-22 mins                    G CodesRandalyn Rhea, [place] 07/10/2017, 5:24 PM

## 2017-07-10 NOTE — Therapy (Signed)
Occupational Therapy Treatment Patient Details Name: Andres Roach MRN: [id number] DOB: 20-Sep-1946 Today's Date: 07/10/2017    History of present illness Pt is a 71 y/o male s/p elective R TKA. PMH includes skin cancer, HTN, and arthritis.    OT comments  Pt required moderate assistance to complete sit to stand transfer from chair with increased time due to pt pain. Pt required min assist to complete toilet transfer and is able to complete all hygiene independently in sitting. Pt advised to sponge bathing initially at home until home health can be assessed the home environment due to safety concerns. Pt agreeable to this. Pt educated on use of AE to complete LB dressing/bathing. Pt reports having a reacher at home. OT will continue to follow acutely to address established goals.     Follow Up Recommendations  HHOT;Supervision/Assistance - 24 hour    Equipment Recommendations  None recommended by OT    Recommendations for Other Services      Precautions / Restrictions Precautions Precautions: Knee Precaution Booklet Issued: Yes (comment) Precaution Comments: Reviewed importance of bone foam for R knee extension.  Restrictions Weight Bearing Restrictions: Yes RLE Weight Bearing: Weight bearing as tolerated       Mobility  Transfers Overall transfer level: Needs assistance Equipment used: Rolling walker (2 wheeled) Transfers: Sit to/from Stand Sit to Stand: Mod assist         General transfer comment: Pt requires mod assist to get from seated position to standing. Min assist for balance and safety during functional mobility.     Balance Overall balance assessment: Needs assistance Sitting-balance support: No upper extremity supported;Feet supported Sitting balance-Leahy Scale: Good     Standing balance support: Bilateral upper extremity supported;During functional activity Standing balance-Leahy Scale: Fair Standing balance comment: Pt able to stand and adjust LB  clothes with one hand supported on the RW.                            ADL either performed or assessed with clinical judgement   ADL       Grooming: Wash/dry hands;Min guard;Standing Grooming Details (indicate cue type and reason): Pt with forearms on sink for support while washing hands.                  Toilet Transfer: Minimal assistance;Ambulation;BSC;RW Toilet Transfer Details (indicate cue type and reason): Pt required min assist to complete toilet trasnfer. Toileting- Water quality scientist and Hygiene: Min guard;Sitting/lateral lean;Sit to/from stand Toileting - Water quality scientist Details (indicate cue type and reason): Pt able to done LB clothing over foot, pull up above knee using lateral leans, and min assist for sit to stand to pull clothing up the rest of the way.      Functional mobility during ADLs: Minimal assistance;Rolling walker General ADL Comments: Pt edcuated on use of sponge bath initially when home due to safety concerns. Pt advised to wait for home health therapy before attempting to transfer in/out of the shower. Pt educated on use of a reacher and long handled sponge to complete LB bathing/dressing.      Vision       Perception     Praxis      Cognition Arousal/Alertness: Awake/alert Behavior During Therapy: WFL for tasks assessed/performed Overall Cognitive Status: Within Functional Limits for tasks assessed  General Comments:        Exercises    Shoulder Instructions       General Comments Pt agreeable to sponge bathe until home health can further asses shower at home.     Pertinent Vitals/ Pain       Pain Assessment: 0-10 Pain Score: 5  Pain Location: R knee Pain Descriptors / Indicators: Sharp ("stings like a bee") Pain Intervention(s): Limited activity within patient's tolerance;Monitored during session;Ice applied;Repositioned  Home Living                                           Prior Functioning/Environment              Frequency  Min 2X/week        Progress Toward Goals  OT Goals(current goals can now be found in the care plan section)  Progress towards OT goals: Progressing toward goals  Acute Rehab OT Goals Patient Stated Goal: To go home OT Goal Formulation: With patient Time For Goal Achievement: 07/22/17 Potential to Achieve Goals: Good  Plan Discharge plan needs to be updated    Co-evaluation                 AM-PAC PT "6 Clicks" Daily Activity     Outcome Measure   Help from another person eating meals?: None Help from another person taking care of personal grooming?: A Little Help from another person toileting, which includes using toliet, bedpan, or urinal?: A Lot Help from another person bathing (including washing, rinsing, drying)?: A Little Help from another person to put on and taking off regular upper body clothing?: None Help from another person to put on and taking off regular lower body clothing?: A Little 6 Click Score: 19    End of Session  OT Visit Diagnosis: Unsteadiness on feet (R26.81);Other abnormalities of gait and mobility (R26.89);Pain;Muscle weakness (generalized) (M62.81) Pain - Right/Left: Right Pain - part of body: Knee   Activity Tolerance Patient tolerated treatment well (Pt with some fatigue during functional acitivities. )   Patient Left in chair;with call bell/phone within reach   Nurse Communication Mobility status        Time: 1010-1042 OT Time Calculation (min): 32 min  Charges: OT General Charges $OT Visit: 1 Procedure OT Treatments $Self Care/Home Management : 23-37 mins  Boykin Peek, [place] #[phone]   Boykin Peek 07/10/2017, 11:17 AM

## 2017-07-10 NOTE — Progress Notes (Signed)
Physical Therapy Treatment Patient Details Name: Andres Roach MRN: [id number] DOB: 1946/04/25 Today's Date: 07/10/2017    History of Present Illness Pt is a 71 y/o male s/p elective R TKA. PMH includes skin cancer, HTN, and arthritis.     PT Comments    Pt tolerated tx well today and slowly progressing toward goals. Pt initially not wanting to perform second PT session but stated that it would help and agreed to utilize PT prior to d/c. Pt requiring max cueing during gait to increase step length on LLE and for RW proximity. After education session on the importance of bone foam, pt only able to tolerate ~15 min of compliance post PT session before requesting to remove foam. Pt able to minimally increase ROM measurements on RLE. Skilled PT remains appropriate for pt to increase gait distance and mobility in RLE. Focus for pm tx to increase gait distance and to perform HEP with minimal cueing.   Follow Up Recommendations  DC plan and follow up therapy as arranged by surgeon;Supervision for mobility/OOB     Equipment Recommendations  None recommended by PT    Recommendations for Other Services       Precautions / Restrictions Precautions Precautions: Knee Precaution Booklet Issued: Yes (comment) Precaution Comments: Reviewed importance of bone foam for R knee extension.  Restrictions Weight Bearing Restrictions: Yes RLE Weight Bearing: Weight bearing as tolerated    Mobility  Bed Mobility Overal bed mobility: Needs Assistance Bed Mobility: Supine to Sit     Supine to sit: Supervision     General bed mobility comments: Pt demonstrated safe bed mobility with no cueing needed, and safe self assist to advance RLE to EOB.  Transfers Overall transfer level: Needs assistance Equipment used: Rolling walker (2 wheeled) Transfers: Sit to/from Stand Sit to Stand: Mod assist         General transfer comment: Pt requires mod assist to get from seated position to standing. Min  assist for balance and safety during functional mobility.   Ambulation/Gait Ambulation/Gait assistance: Min guard Ambulation Distance (Feet): 100 Feet Assistive device: Rolling walker (2 wheeled) Gait Pattern/deviations: Step-to pattern;Step-through pattern;Decreased step length - left;Decreased stance time - right;Decreased stride length;Decreased dorsiflexion - right;Decreased weight shift to right;Antalgic;Trunk flexed Gait velocity: Decreased   General Gait Details: Pt min guard for stability, initailly step to pattern then able to perform step through with max cueing to increase step length on LLE and to increase R dorsiflexion to assist in heel strike and knee extension on RLE.   Stairs            Wheelchair Mobility    Modified Rankin (Stroke Patients Only)       Balance Overall balance assessment: Needs assistance Sitting-balance support: No upper extremity supported;Feet supported Sitting balance-Leahy Scale: Good     Standing balance support: Bilateral upper extremity supported;During functional activity Standing balance-Leahy Scale: Fair Standing balance comment: Pt able to stand and adjust LB clothes with one hand supported on the RW.                             Cognition Arousal/Alertness: Awake/alert Behavior During Therapy: WFL for tasks assessed/performed Overall Cognitive Status: Within Functional Limits for tasks assessed                                 General Comments: Pt stated he can only  tolerate bone foam for no longer than 30 minutes, prior to SPTA entering room.       Exercises Total Joint Exercises Quad Sets: AROM;Right;10 reps;Seated Goniometric ROM: 20-88 on R knee.    General Comments General comments (skin integrity, edema, etc.): Pt agreeable to sponge bathe until home health can further asses shower at home.       Pertinent Vitals/Pain Pain Assessment: 0-10 Pain Score: 5  Pain Location: R knee Pain  Descriptors / Indicators: Sharp ("stings like a bee") Pain Intervention(s): Limited activity within patient's tolerance;Monitored during session;Ice applied;Repositioned    Home Living                      Prior Function            PT Goals (current goals can now be found in the care plan section) Acute Rehab PT Goals Patient Stated Goal: To go home PT Goal Formulation: With patient Potential to Achieve Goals: Good Progress towards PT goals: Progressing toward goals    Frequency    7X/week      PT Plan Current plan remains appropriate    Co-evaluation              AM-PAC PT "6 Clicks" Daily Activity  Outcome Measure  Difficulty turning over in bed (including adjusting bedclothes, sheets and blankets)?: None Difficulty moving from lying on back to sitting on the side of the bed? : A Little Difficulty sitting down on and standing up from a chair with arms (e.g., wheelchair, bedside commode, etc,.)?: A Lot Help needed moving to and from a bed to chair (including a wheelchair)?: A Lot Help needed walking in hospital room?: A Little Help needed climbing 3-5 steps with a railing? : A Little 6 Click Score: 17    End of Session Equipment Utilized During Treatment: Gait belt Activity Tolerance: Patient tolerated treatment well Patient left: in chair;with call bell/phone within reach Nurse Communication: Mobility status PT Visit Diagnosis: Other abnormalities of gait and mobility (R26.89);Pain Pain - Right/Left: Right Pain - part of body: Knee     Time: 9574-7340 PT Time Calculation (min) (ACUTE ONLY): 32 min  Charges:  $Gait Training: 8-22 mins $Therapeutic Exercise: 8-22 mins                    G CodesRandalyn Rhea, [place] [phone]   Randalyn Rhea 07/10/2017, 1:42 PM

## 2017-07-10 NOTE — Progress Notes (Signed)
Patient tolerated PT and OT well today. Went over d/c paperwork with patient. Daughter took prescriptions home yesterday. Equipment (walker- Patient refused 3-in-one commode) delivered to patient's room.

## 2017-12-05 IMAGING — CR DG CHEST 2V
2 series · 2 of 2 positions shown · non-contrast
Comparison: none

CLINICAL DATA: Pre-op films for right knee replacement surgery, no
chest complaints or conditions.

EXAM:
CHEST - 2 VIEW

[w chest pa]
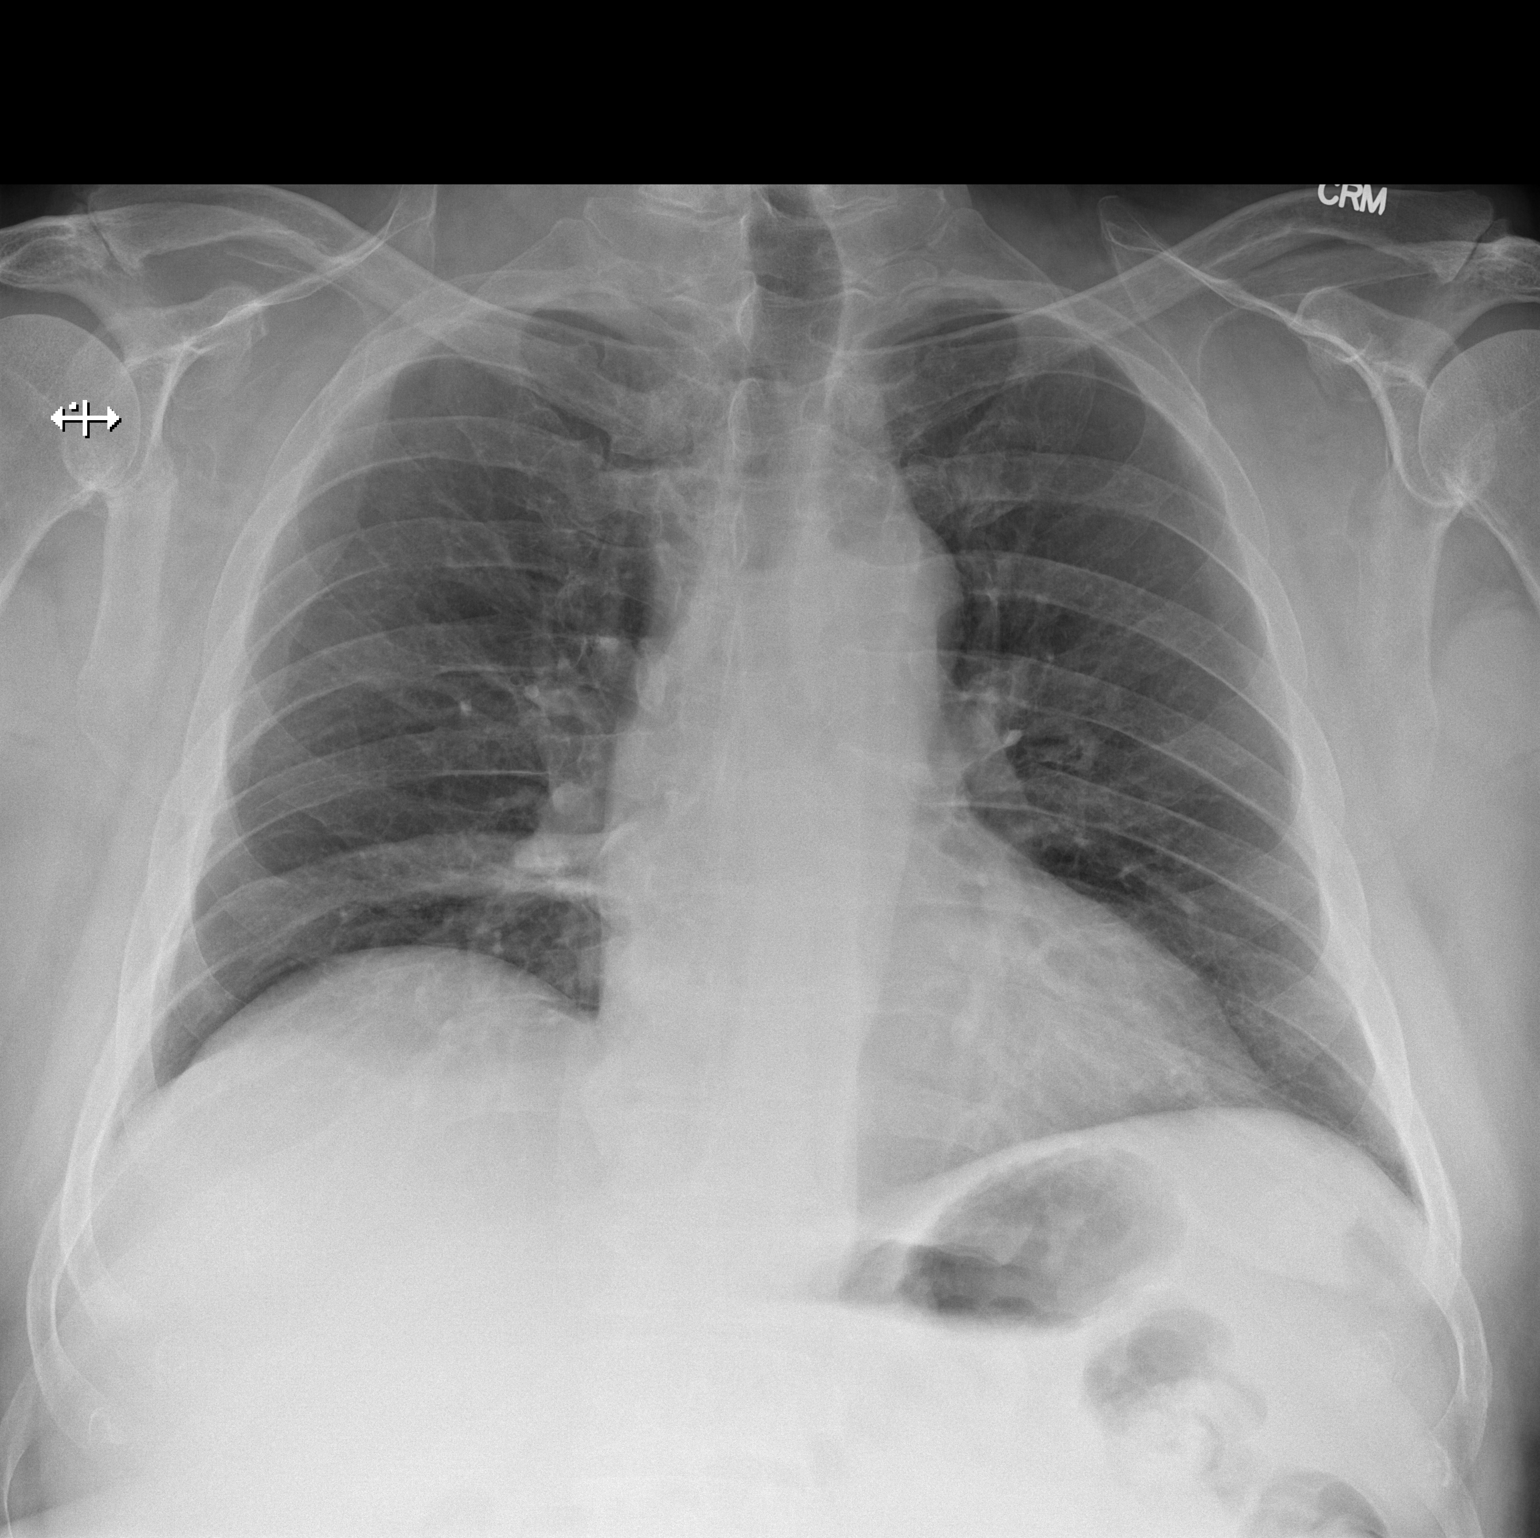

[w chest lat]
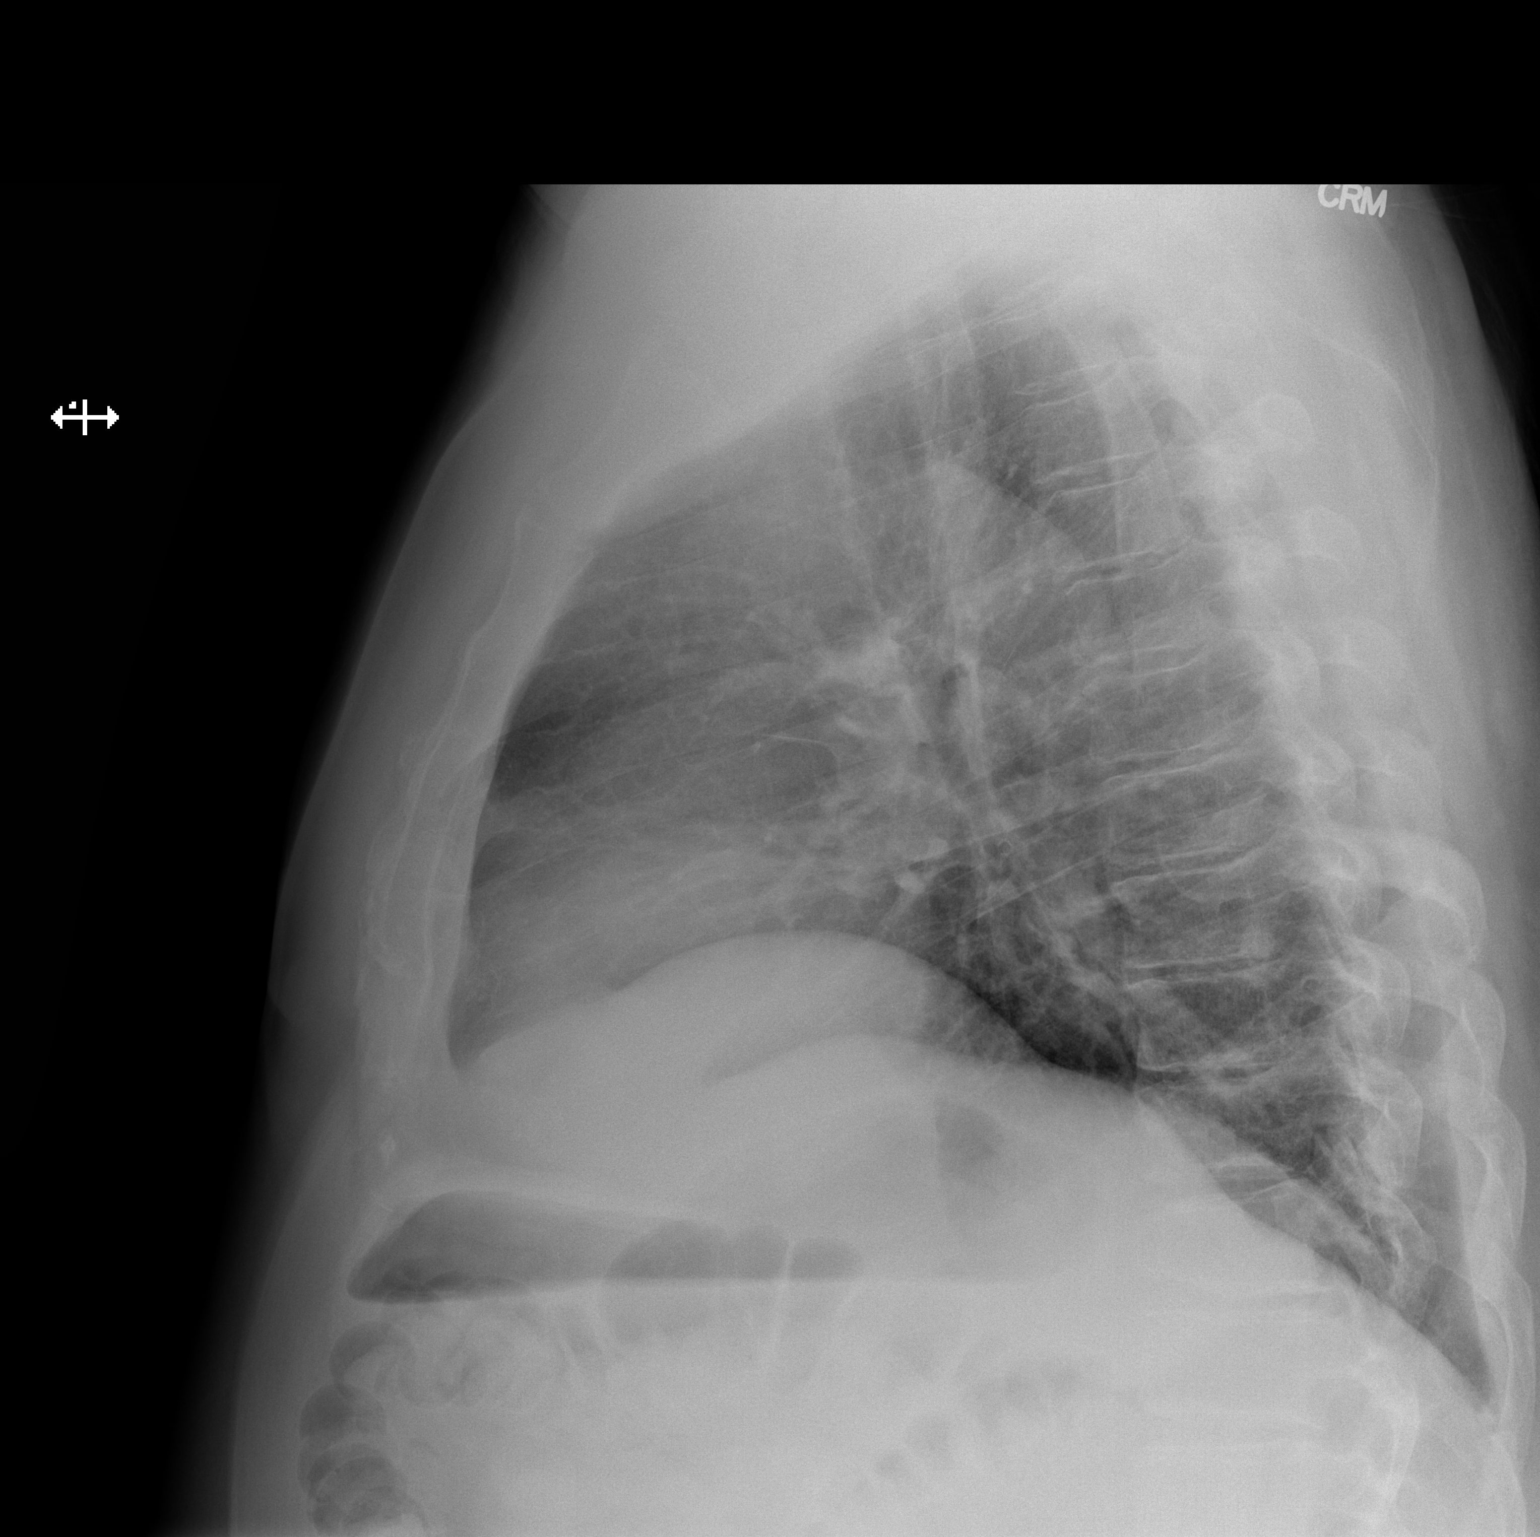

[2 of 2 positions shown; findings below may reference images not displayed]

FINDINGS: Linear scarring or subsegmental atelectasis medially at the right
lung base. Lungs otherwise clear.

Heart size and mediastinal contours are within normal limits.

No effusion.

Visualized bones unremarkable.
IMPRESSION: Minimal right lower lobe linear scarring or atelectasis. In the
absence of prior studies demonstrating stability, consider follow-up
film to confirm appropriate resolution.

## 2017-12-25 DIAGNOSIS — Z01818 Encounter for other preprocedural examination: Secondary | ICD-10-CM | POA: Diagnosis not present

## 2021-04-30 DIAGNOSIS — J9600 Acute respiratory failure, unspecified whether with hypoxia or hypercapnia: Secondary | ICD-10-CM

## 2021-04-30 DIAGNOSIS — I517 Cardiomegaly: Secondary | ICD-10-CM | POA: Diagnosis not present

## 2022-12-26 DEATH — deceased
